# Patient Record
Sex: Female | Born: 1990 | Race: Black or African American | Hispanic: No | Smoking: Never smoker
Health system: Southern US, Community
[De-identification: ages and names within clinical notes are randomized; demographics above are authoritative.]

## PROBLEM LIST (undated history)

## (undated) DIAGNOSIS — K760 Fatty (change of) liver, not elsewhere classified: Secondary | ICD-10-CM

## (undated) DIAGNOSIS — K219 Gastro-esophageal reflux disease without esophagitis: Secondary | ICD-10-CM

## (undated) DIAGNOSIS — Z8759 Personal history of other complications of pregnancy, childbirth and the puerperium: Secondary | ICD-10-CM

## (undated) DIAGNOSIS — F329 Major depressive disorder, single episode, unspecified: Secondary | ICD-10-CM

## (undated) DIAGNOSIS — F419 Anxiety disorder, unspecified: Secondary | ICD-10-CM

## (undated) DIAGNOSIS — F32A Depression, unspecified: Secondary | ICD-10-CM

## (undated) DIAGNOSIS — T4145XA Adverse effect of unspecified anesthetic, initial encounter: Secondary | ICD-10-CM

## (undated) DIAGNOSIS — R519 Headache, unspecified: Secondary | ICD-10-CM

## (undated) DIAGNOSIS — J342 Deviated nasal septum: Secondary | ICD-10-CM

## (undated) DIAGNOSIS — I2699 Other pulmonary embolism without acute cor pulmonale: Secondary | ICD-10-CM

## (undated) DIAGNOSIS — T8859XA Other complications of anesthesia, initial encounter: Secondary | ICD-10-CM

## (undated) DIAGNOSIS — I10 Essential (primary) hypertension: Secondary | ICD-10-CM

## (undated) HISTORY — PX: NASAL SEPTUM SURGERY: SHX37

## (undated) HISTORY — PX: BREAST REDUCTION SURGERY: SHX8

## (undated) HISTORY — PX: WISDOM TOOTH EXTRACTION: SHX21

## (undated) HISTORY — DX: Essential (primary) hypertension: I10

## (undated) HISTORY — PX: TONSILLECTOMY: SUR1361

## (undated) HISTORY — DX: Deviated nasal septum: J34.2

## (undated) HISTORY — DX: Fatty (change of) liver, not elsewhere classified: K76.0

---
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
## 2019-05-01 ENCOUNTER — Other Ambulatory Visit: Payer: Self-pay

## 2019-05-01 ENCOUNTER — Encounter: Payer: Self-pay | Admitting: Adult Health

## 2019-05-01 ENCOUNTER — Ambulatory Visit (INDEPENDENT_AMBULATORY_CARE_PROVIDER_SITE_OTHER): Payer: 59 | Admitting: Adult Health

## 2019-05-01 DIAGNOSIS — Z3A01 Less than 8 weeks gestation of pregnancy: Secondary | ICD-10-CM | POA: Insufficient documentation

## 2019-05-01 DIAGNOSIS — O3680X0 Pregnancy with inconclusive fetal viability, not applicable or unspecified: Secondary | ICD-10-CM | POA: Diagnosis not present

## 2019-05-01 DIAGNOSIS — Z3201 Encounter for pregnancy test, result positive: Secondary | ICD-10-CM

## 2019-05-01 MED ORDER — PRENATAL PLUS 27-1 MG PO TABS: 1.0000 | ORAL_TABLET | Freq: Every day | ORAL | 12 refills | Status: DC

## 2019-05-01 NOTE — Progress Notes (Signed)
Patient ID: Terri Cochran, female   DOB: 05/21/1991, 28 y.o.   MRN: [id number]   TELEHEALTH VIRTUAL GYNECOLOGY VISIT ENCOUNTER NOTE  I connected with Terri Cochran on 04/30/28 at 10:15 AM EDT by telephone at home and verified that I am speaking with the correct person using two identifiers.   I discussed the limitations, risks, security and privacy concerns of performing an evaluation and management service by telephone and the availability of in person appointments. I also discussed with the patient that there may be a patient responsible charge related to this service. The patient expressed understanding and agreed to proceed.   History:  Terri Cochran is a 28 y.o. G1P0 female being evaluated today for having missed a period and had 4+HPTs, so about 5+6 weeks by LMP 03/21/19, with EDD 12/26/19, she got married 5/8 and had sex for the first time then. She has some low back pain. She denies any abnormal vaginal discharge, bleeding, pelvic pain or other concerns.       Past Medical History:  Diagnosis Date  . Deviated septum    Past Surgical History:  Procedure Laterality Date  . BREAST REDUCTION SURGERY    . NASAL SEPTUM SURGERY     The following portions of the patient's history were reviewed and updated as appropriate: allergies, current medications, past family history, past medical history, past social history, past surgical history and problem list.   Health Maintenance:  None on file.  Review of Systems:  Pertinent items noted in HPI and remainder of comprehensive ROS otherwise negative.  Physical Exam:   General:  Alert, oriented and cooperative.   Mental Status: Normal mood and affect perceived. Normal judgment and thought content.  Physical exam deferred due to nature of the encounter Fall risk is low. PHQ 2 score 0. She is aware deliveries at [facility] and about after hours call service. Reviewed symptoms of early pregnancy with her.   Labs and Imaging No results found for this or any  previous visit (from the past 336 hour(s)). No results found.    Assessment and Plan:     1. Positive pregnancy test 4+HPTs  2. Less than [redacted] weeks gestation of pregnancy -take PNV -can take tylenol and use ice for low back pain  3. Encounter to determine fetal viability of pregnancy, single or unspecified fetus -dating [place] in 2-3 weeks  - US OB Comp Less 14 Wks; Future       I discussed the assessment and treatment plan with the patient. The patient was provided an opportunity to ask questions and all were answered. The patient agreed with the plan and demonstrated an understanding of the instructions.   The patient was advised to call back or seek an in-person evaluation/go to the ED if the symptoms worsen or if the condition fails to improve as anticipated.  I provided 7 minutes of non-face-to-face time during this encounter.   Derrek Monaco, NP [facility] [employer] [facility], [place]

## 2019-05-07 ENCOUNTER — Telehealth: Payer: Self-pay | Admitting: Adult Health

## 2019-05-07 ENCOUNTER — Other Ambulatory Visit: Payer: Self-pay

## 2019-05-07 ENCOUNTER — Emergency Department: Payer: 59

## 2019-05-07 ENCOUNTER — Encounter: Payer: Self-pay | Admitting: Emergency Medicine

## 2019-05-07 ENCOUNTER — Emergency Department
Admission: EM | Admit: 2019-05-07 | Discharge: 2019-05-07 | Disposition: A | Discharge status: Home / Self Care | Payer: 59 | Attending: Emergency Medicine | Admitting: Emergency Medicine

## 2019-05-07 ENCOUNTER — Telehealth: Payer: Self-pay | Admitting: *Deleted

## 2019-05-07 DIAGNOSIS — O418X11 Other specified disorders of amniotic fluid and membranes, first trimester, fetus 1: Secondary | ICD-10-CM | POA: Diagnosis not present

## 2019-05-07 DIAGNOSIS — O209 Hemorrhage in early pregnancy, unspecified: Secondary | ICD-10-CM | POA: Diagnosis present

## 2019-05-07 DIAGNOSIS — Z3A01 Less than 8 weeks gestation of pregnancy: Secondary | ICD-10-CM | POA: Insufficient documentation

## 2019-05-07 DIAGNOSIS — O418X10 Other specified disorders of amniotic fluid and membranes, first trimester, not applicable or unspecified: Secondary | ICD-10-CM

## 2019-05-07 DIAGNOSIS — O4690 Antepartum hemorrhage, unspecified, unspecified trimester: Secondary | ICD-10-CM

## 2019-05-07 DIAGNOSIS — O468X1 Other antepartum hemorrhage, first trimester: Secondary | ICD-10-CM

## 2019-05-07 LAB — COMPREHENSIVE METABOLIC PANEL
ALT: 22 U/L (ref 0–44)
AST: 27 U/L (ref 15–41)
Albumin: 4.6 g/dL (ref 3.5–5.0)
Alkaline Phosphatase: 50 U/L (ref 38–126)
Anion gap: 12 (ref 5–15)
BUN: 15 mg/dL (ref 6–20)
CO2: 22 mmol/L (ref 22–32)
Calcium: 9.5 mg/dL (ref 8.9–10.3)
Chloride: 105 mmol/L (ref 98–111)
Creatinine, Ser: 0.96 mg/dL (ref 0.44–1.00)
GFR calc Af Amer: >60 mL/min (ref >60)
GFR calc non Af Amer: >60 mL/min (ref >60)
Glucose, Bld: 98 mg/dL (ref 70–99)
Potassium: 3.9 mmol/L (ref 3.5–5.1)
Sodium: 139 mmol/L (ref 135–145)
Total Bilirubin: 0.3 mg/dL (ref 0.3–1.2)
Total Protein: 8.5 g/dL — ABNORMAL HIGH (ref 6.5–8.1)

## 2019-05-07 LAB — URINALYSIS, COMPLETE (UACMP) WITH MICROSCOPIC
Bilirubin Urine: NEGATIVE
Glucose, UA: NEGATIVE mg/dL
Ketones, ur: NEGATIVE mg/dL
Nitrite: NEGATIVE
Protein, ur: NEGATIVE mg/dL
Specific Gravity, Urine: 1.018 (ref 1.005–1.030)
pH: 7 (ref 5.0–8.0)

## 2019-05-07 LAB — CBC WITH DIFFERENTIAL/PLATELET
Abs Immature Granulocytes: 0.02 10*3/uL (ref 0.00–0.07)
Basophils Absolute: 0.1 10*3/uL (ref 0.0–0.1)
Basophils Relative: 1 %
Eosinophils Absolute: 0.3 10*3/uL (ref 0.0–0.5)
Eosinophils Relative: 2 %
HCT: 41.2 % (ref 36.0–46.0)
Hemoglobin: 13.3 g/dL (ref 12.0–15.0)
Immature Granulocytes: 0 %
Lymphocytes Relative: 28 %
Lymphs Abs: 3.3 10*3/uL (ref 0.7–4.0)
MCH: 26 pg (ref 26.0–34.0)
MCHC: 32.3 g/dL (ref 30.0–36.0)
MCV: 80.6 fL (ref 80.0–100.0)
Monocytes Absolute: 0.9 10*3/uL (ref 0.1–1.0)
Monocytes Relative: 8 %
Neutro Abs: 7.1 10*3/uL (ref 1.7–7.7)
Neutrophils Relative %: 61 %
Platelets: 367 10*3/uL (ref 150–400)
RBC: 5.11 MIL/uL (ref 3.87–5.11)
RDW: 15.2 % (ref 11.5–15.5)
WBC: 11.7 10*3/uL — ABNORMAL HIGH (ref 4.0–10.5)
nRBC: 0 % (ref 0.0–0.2)

## 2019-05-07 LAB — PREGNANCY, URINE: Preg Test, Ur: POSITIVE — AB

## 2019-05-07 LAB — HCG, QUANTITATIVE, PREGNANCY: hCG, Beta Chain, Quant, S: 17650 m[IU]/mL — ABNORMAL HIGH (ref <5)

## 2019-05-07 LAB — ABO/RH: ABO/RH(D): B POS
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
## 2019-05-07 NOTE — ED Notes (Signed)
EDP Goodman at bedside.  

## 2019-05-07 NOTE — ED Notes (Signed)
Pt walked out without d/c paperwork, vital signs, etc. Pt did not ask for this RN or let this RN know she was leaving.
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
## 2019-05-07 NOTE — Discharge Instructions (Signed)
Please seek medical attention for any high fevers, chest pain, shortness of breath, change in behavior, persistent vomiting, bloody stool or any other new or concerning symptoms.  

## 2019-05-07 NOTE — ED Triage Notes (Signed)
Pt states she is about [redacted] weeks pregnant with her first baby, states when she wipes, bright red blood on paper, began this afternoon. Mild LLQ cramping today as well. NAD.
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
## 2019-05-09 ENCOUNTER — Telehealth: Payer: Self-pay | Admitting: Obstetrics and Gynecology

## 2019-05-09 NOTE — Telephone Encounter (Signed)
Pulled pt's chart to receiving a fax from ED stating she has a ED referral Summary with disclaimer. After pulling pt, ED possibly faxed referral to wrong office, Pt has never been seen at [facility]. Thank you.

## 2019-05-15 ENCOUNTER — Telehealth: Payer: Self-pay | Admitting: Obstetrics and Gynecology

## 2019-05-15 NOTE — Telephone Encounter (Signed)

## 2019-05-16 ENCOUNTER — Other Ambulatory Visit: Payer: Self-pay

## 2019-05-16 ENCOUNTER — Ambulatory Visit (INDEPENDENT_AMBULATORY_CARE_PROVIDER_SITE_OTHER): Payer: 59

## 2019-05-16 DIAGNOSIS — O3680X0 Pregnancy with inconclusive fetal viability, not applicable or unspecified: Secondary | ICD-10-CM | POA: Diagnosis not present

## 2019-05-16 DIAGNOSIS — Z3A08 8 weeks gestation of pregnancy: Secondary | ICD-10-CM | POA: Diagnosis not present

## 2019-05-16 NOTE — Progress Notes (Signed)
[place] 7+1 wks,single IUP w/ys,positive fht 150 bpm,normal ovaries bilat,no fibroid visualized,a small posterior myometrial cyst 7 x 4 x 6 mm,CRL 10.98 mm

## 2019-06-13 ENCOUNTER — Telehealth: Payer: Self-pay

## 2019-06-13 NOTE — Telephone Encounter (Signed)
Copied from [place] [phone]. Topic: Appointment Scheduling - Scheduling Inquiry for Clinic >> Jun 13, 2019  2:00 PM Oneta Rack wrote: Patient would like to establish care with Dr. Derrel Nip due to spouse Rosine Beat [phone] already being a patient of Dr. Derrel Nip, please advise (aware Dr. Derrel Nip but wanted to ask) please advise

## 2019-06-16 ENCOUNTER — Other Ambulatory Visit: Payer: Self-pay

## 2019-06-16 ENCOUNTER — Inpatient Hospital Stay (HOSPITAL_COMMUNITY)
Admission: EM | Admit: 2019-06-16 | Discharge: 2019-06-16 | Disposition: A | Discharge status: Home / Self Care | Payer: 59 | Attending: Obstetrics & Gynecology | Admitting: Obstetrics & Gynecology

## 2019-06-16 ENCOUNTER — Encounter (HOSPITAL_COMMUNITY): Payer: Self-pay | Admitting: *Deleted

## 2019-06-16 DIAGNOSIS — Z3A11 11 weeks gestation of pregnancy: Secondary | ICD-10-CM

## 2019-06-16 DIAGNOSIS — O208 Other hemorrhage in early pregnancy: Secondary | ICD-10-CM | POA: Diagnosis not present

## 2019-06-16 DIAGNOSIS — O209 Hemorrhage in early pregnancy, unspecified: Secondary | ICD-10-CM | POA: Insufficient documentation

## 2019-06-16 HISTORY — DX: Anxiety disorder, unspecified: F41.9

## 2019-06-16 HISTORY — DX: Depression, unspecified: F32.A

## 2019-06-16 HISTORY — DX: Headache, unspecified: R51.9

## 2019-06-16 HISTORY — DX: Gastro-esophageal reflux disease without esophagitis: K21.9

## 2019-06-16 MED ORDER — TERCONAZOLE 0.4 % VA CREA: 1.0000 | TOPICAL_CREAM | Freq: Every day | VAGINAL | 0 refills | Status: DC

## 2019-06-16 NOTE — MAU Provider Note (Signed)
History     CSN: [id number]  Arrival date and time: 06/16/19 2106  Chief Complaint  Patient presents with  . Vaginal Bleeding   HPI Terri Cochran is a 28 y.o. G1P0 at [redacted]w[redacted]d who presents with vaginal bleeding. She was seen in June at [facility] and diagnosed with a subchorionic hemorrhage and had a viable pregnancy on a follow up ultrasound a week later. She states she has been seeing blood daily since then but had more tonight. She denies any pain. Denies any abnormal discharge. She has been seen at [facility] but planning to go somewhere closer to [place] in the future.   OB History    Gravida  1   Para      Term      Preterm      AB      Living        SAB      TAB      Ectopic      Multiple      Live Births              Past Medical History:  Diagnosis Date  . Anxiety   . Depression   . Deviated septum   . GERD (gastroesophageal reflux disease)   . Headache     Past Surgical History:  Procedure Laterality Date  . BREAST REDUCTION SURGERY    . NASAL SEPTUM SURGERY    . TONSILLECTOMY    . WISDOM TOOTH EXTRACTION      Family History  Problem Relation Age of Onset  . Heart attack Father     Social History   Tobacco Use  . Smoking status: Never Smoker  . Smokeless tobacco: Never Used  Substance Use Topics  . Alcohol use: Not Currently  . Drug use: Not Currently    Allergies: No Known Allergies  Medications Prior to Admission  Medication Sig Dispense Refill Last Dose  . prenatal vitamin w/FE, FA (PRENATAL 1 + 1) 27-1 MG TABS tablet Take 1 tablet by mouth daily at 12 noon. 30 each 12 06/16/2019 at Unknown time    Review of Systems  Constitutional: Negative.  Negative for fatigue and fever.  HENT: Negative.   Respiratory: Negative.  Negative for shortness of breath.   Cardiovascular: Negative.  Negative for chest pain.  Gastrointestinal: Negative.  Negative for abdominal pain, constipation, diarrhea, nausea and vomiting.  Genitourinary:  Positive for vaginal bleeding. Negative for dysuria.  Neurological: Negative.  Negative for dizziness and headaches.   Physical Exam   Last menstrual period 03/21/2019.  Physical Exam  Nursing note and vitals reviewed. Constitutional: She is oriented to person, place, and time. She appears well-developed and well-nourished. No distress.  HENT:  Head: Normocephalic.  Eyes: Pupils are equal, round, and reactive to light.  Cardiovascular: Normal rate, regular rhythm and normal heart sounds.  Respiratory: Effort normal and breath sounds normal. No respiratory distress.  GI: Soft. Bowel sounds are normal. She exhibits no distension. There is no abdominal tenderness.  Genitourinary:    Vaginal discharge (Thick, white, adherent discharge) present.     Genitourinary Comments: Small amount of dark red blood in vault. No active bleeding. Cervix closed/thick posterior.    Neurological: She is alert and oriented to person, place, and time.  Skin: Skin is warm and dry.  Psychiatric: She has a normal mood and affect. Her behavior is normal. Judgment and thought content normal.    MAU Course  Procedures  MDM B Pos blood type  Limited bedside u/s performed to confirm viability. Fetus measuring approximately 11 weeks CRL with FHR of 167 bpm noted.   CNM collected vaginal swabs and UA but were sent to lab with wrong requisition. Lab discarded samples and patient declined recollect tonight.   Discussed with patient normalcy of bleeding with known subchorionic hemorrhage. Reassured that bleeding may continue during early pregnancy but fetus is doing well today.   Yeast visualized on exam. Will treat due to clinical presentation  Assessment and Plan   1. Vaginal bleeding affecting early pregnancy   2. [redacted] weeks gestation of pregnancy    -Discharge home in stable condition -Rx for terazol sent to patient's pharmacy -Vaginal bleeding precautions discussed -Patient advised to follow-up with OB of  choice to start prenatal care -Patient may return to MAU as needed or if her condition were to change or worsen   [place] 06/16/2019, 10:59 PM

## 2019-06-16 NOTE — Discharge Instructions (Signed)
Subchorionic Hematoma  A subchorionic hematoma is a gathering of blood between the outer wall of the embryo (chorion) and the inner wall of the womb (uterus). This condition can cause vaginal bleeding. If they cause little or no vaginal bleeding, early small hematomas usually shrink on their own and do not affect your baby or pregnancy. When bleeding starts later in pregnancy, or if the hematoma is larger or occurs in older pregnant women, the condition may be more serious. Larger hematomas may get bigger, which increases the chances of miscarriage. This condition also increases the risk of:  Premature separation of the placenta from the uterus.  Premature (preterm) labor.  Stillbirth. What are the causes? The exact cause of this condition is not known. It occurs when blood is trapped between the placenta and the uterine wall because the placenta has separated from the original site of implantation. What increases the risk? You are more likely to develop this condition if:  You were treated with fertility medicines.  You conceived through in vitro fertilization (IVF). What are the signs or symptoms? Symptoms of this condition include:  Vaginal spotting or bleeding.  Contractions of the uterus. These cause abdominal pain. Sometimes you may have no symptoms and the bleeding may only be seen when ultrasound images are taken (transvaginal ultrasound). How is this diagnosed? This condition is diagnosed based on a physical exam. This includes a pelvic exam. You may also have other tests, including:  Blood tests.  Urine tests.  Ultrasound of the abdomen. How is this treated? Treatment for this condition can vary. Treatment may include:  Watchful waiting. You will be monitored closely for any changes in bleeding. During this stage: ? The hematoma may be reabsorbed by the body. ? The hematoma may separate the fluid-filled space containing the embryo (gestational sac) from the wall of the  womb (endometrium).  Medicines.  Activity restriction. This may be needed until the bleeding stops. Follow these instructions at home:  Stay on bed rest if told to do so by your health care provider.  Do not lift anything that is heavier than 10 lbs. (4.5 kg) or as told by your health care provider.  Do not use any products that contain nicotine or tobacco, such as cigarettes and e-cigarettes. If you need help quitting, ask your health care provider.  Track and write down the number of pads you use each day and how soaked (saturated) they are.  Do not use tampons.  Keep all follow-up visits as told by your health care provider. This is important. Your health care provider may ask you to have follow-up blood tests or ultrasound tests or both. Contact a health care provider if:  You have any vaginal bleeding.  You have a fever. Get help right away if:  You have severe cramps in your stomach, back, abdomen, or pelvis.  You pass large clots or tissue. Save any tissue for your health care provider to look at.  You have more vaginal bleeding, and you faint or become lightheaded or weak. Summary  A subchorionic hematoma is a gathering of blood between the outer wall of the placenta and the uterus.  This condition can cause vaginal bleeding.  Sometimes you may have no symptoms and the bleeding may only be seen when ultrasound images are taken.  Treatment may include watchful waiting, medicines, or activity restriction. This information is not intended to replace advice given to you by your health care provider. Make sure you discuss any questions you  have with your health care provider. Document Released: 02/22/2007 Document Revised: 10/20/2017 Document Reviewed: 01/03/2017 Elsevier Patient Education  [place] Medications in Pregnancy   Acne: Benzoyl Peroxide Salicylic Acid  Backache/Headache: Tylenol: 2 regular strength every 4 hours OR              2  Extra strength every 6 hours  Colds/Coughs/Allergies: Benadryl (alcohol free) 25 mg every 6 hours as needed Breath right strips Claritin Cepacol throat lozenges Chloraseptic throat spray Cold-Eeze- up to three times per day Cough drops, alcohol free Flonase (by prescription only) Guaifenesin Mucinex Robitussin DM (plain only, alcohol free) Saline nasal spray/drops Sudafed (pseudoephedrine) & Actifed ** use only after [redacted] weeks gestation and if you do not have high blood pressure Tylenol Vicks Vaporub Zinc lozenges Zyrtec   Constipation: Colace Ducolax suppositories Fleet enema Glycerin suppositories Metamucil Milk of magnesia Miralax Senokot Smooth move tea  Diarrhea: Kaopectate Imodium A-D  *NO pepto Bismol  Hemorrhoids: Anusol Anusol HC Preparation H Tucks  Indigestion: Tums Maalox Mylanta Zantac  Pepcid  Insomnia: Benadryl (alcohol free) 25mg  every 6 hours as needed Tylenol PM Unisom, no Gelcaps  Leg Cramps: Tums MagGel  Nausea/Vomiting:  Bonine Dramamine Emetrol Ginger extract Sea bands Meclizine  Nausea medication to take during pregnancy:  Unisom (doxylamine succinate 25 mg tablets) Take one tablet daily at bedtime. If symptoms are not adequately controlled, the dose can be increased to a maximum recommended dose of two tablets daily (1/2 tablet in the morning, 1/2 tablet mid-afternoon and one at bedtime). Vitamin B6 100mg  tablets. Take one tablet twice a day (up to 200 mg per day).  Skin Rashes: Aveeno products Benadryl cream or 25mg  every 6 hours as needed Calamine Lotion 1% cortisone cream  Yeast infection: Gyne-lotrimin 7 Monistat 7   **If taking multiple medications, please check labels to avoid duplicating the same active ingredients **take medication as directed on the label ** Do not exceed 4000 mg of tylenol in 24 hours **Do not take medications that contain aspirin or ibuprofen

## 2019-06-18 NOTE — Telephone Encounter (Signed)
Lm on vm to call the office to set up a virtual new patient appt, 06/18/2019.

## 2019-06-18 NOTE — Telephone Encounter (Signed)
Please schedule a new pt appt to establish care.

## 2019-06-18 NOTE — Telephone Encounter (Signed)
Ok to establish care with me.

## 2019-06-19 ENCOUNTER — Telehealth: Payer: 59

## 2019-06-20 ENCOUNTER — Encounter: Payer: Self-pay | Admitting: Obstetrics and Gynecology

## 2019-06-24 ENCOUNTER — Telehealth: Payer: Self-pay | Admitting: Radiology

## 2019-06-24 ENCOUNTER — Other Ambulatory Visit: Payer: BLUE CROSS/BLUE SHIELD

## 2019-06-24 ENCOUNTER — Encounter: Payer: Self-pay | Admitting: Radiology

## 2019-06-24 ENCOUNTER — Inpatient Hospital Stay (HOSPITAL_COMMUNITY): Payer: 59

## 2019-06-24 ENCOUNTER — Encounter (HOSPITAL_COMMUNITY): Payer: Self-pay

## 2019-06-24 ENCOUNTER — Inpatient Hospital Stay (HOSPITAL_COMMUNITY)
Admission: AD | Admit: 2019-06-24 | Discharge: 2019-06-24 | Disposition: A | Discharge status: Home / Self Care | Payer: 59 | Attending: Obstetrics and Gynecology | Admitting: Obstetrics and Gynecology

## 2019-06-24 ENCOUNTER — Encounter: Payer: BLUE CROSS/BLUE SHIELD | Admitting: Women's Health

## 2019-06-24 ENCOUNTER — Other Ambulatory Visit: Payer: Self-pay

## 2019-06-24 ENCOUNTER — Ambulatory Visit: Payer: BLUE CROSS/BLUE SHIELD | Admitting: *Deleted

## 2019-06-24 DIAGNOSIS — O039 Complete or unspecified spontaneous abortion without complication: Secondary | ICD-10-CM

## 2019-06-24 DIAGNOSIS — Z6720 Type B blood, Rh positive: Secondary | ICD-10-CM

## 2019-06-24 DIAGNOSIS — O4690 Antepartum hemorrhage, unspecified, unspecified trimester: Secondary | ICD-10-CM

## 2019-06-24 LAB — CBC
HCT: 36.1 % (ref 36.0–46.0)
Hemoglobin: 11.9 g/dL — ABNORMAL LOW (ref 12.0–15.0)
MCH: 26.6 pg (ref 26.0–34.0)
MCHC: 33 g/dL (ref 30.0–36.0)
MCV: 80.8 fL (ref 80.0–100.0)
Platelets: 350 10*3/uL (ref 150–400)
RBC: 4.47 MIL/uL (ref 3.87–5.11)
RDW: 14.5 % (ref 11.5–15.5)
WBC: 8.8 10*3/uL (ref 4.0–10.5)
nRBC: 0 % (ref 0.0–0.2)

## 2019-06-24 MED ORDER — HYDROCODONE-ACETAMINOPHEN 5-325 MG PO TABS
1.0000 | ORAL_TABLET | Freq: Once | ORAL | Status: AC
  Administered 2019-06-24: 1 via ORAL
  Filled 2019-06-24: qty 1

## 2019-06-24 MED ORDER — HYDROCODONE-ACETAMINOPHEN 5-325 MG PO TABS: 1.0000 | ORAL_TABLET | Freq: Four times a day (QID) | ORAL | 0 refills | Status: DC | PRN

## 2019-06-24 MED ORDER — ONDANSETRON 4 MG PO TBDP
8.0000 mg | ORAL_TABLET | Freq: Once | ORAL | Status: AC
  Administered 2019-06-24: 8 mg via ORAL
  Filled 2019-06-24: qty 2

## 2019-06-24 MED ORDER — FLUCONAZOLE 150 MG PO TABS
150.0000 mg | ORAL_TABLET | Freq: Once | ORAL | Status: AC
  Administered 2019-06-24: 150 mg via ORAL
  Filled 2019-06-24: qty 1

## 2019-06-24 NOTE — MAU Provider Note (Addendum)
History     CSN: [id number]  Arrival date and time: 06/24/19 1250   First Provider Initiated Contact with Patient 06/24/19 1547      Chief Complaint  Patient presents with  . Vaginal Bleeding   HPI [name] is a 28 y/o G1P0010 seen in MAU for increased bleeding and contractions after a spontaneous abortion on 06/20/2019 @[redacted]w[redacted]d . Pt was diagnosed @7w  gestation with moderate subchorionic hemorrhage measuring 1.8 x 1.3 x 1.2 cm during an ER visit for vaginal bleeding. Pt last seen at MAU on 06/16/2019 for vaginal bleeding and discharged home in stable condition after a bedside US confirmed viability of fetus measuring 11 wks CRL with FHR 167 bpm. Pt was seen in an ER in [facility] on 06/19/2019 for contractions, increased vaginal bleeding, and passing of tissue. Pt was discharged home following normal [place]. Pt returned to ER the next day after passing her fetus in her hotel room. US showed incomplete abortion and Pt was given 4 pills of Cytotec. Vaginal bleeding had slowed following the Cytotec, but she experienced an increase this morning. Pt was unable to establish care with an OBGYN during recent pregnancy.   OB History    Gravida  1   Para      Term      Preterm      AB      Living        SAB      TAB      Ectopic      Multiple      Live Births              Past Medical History:  Diagnosis Date  . Anxiety   . Depression   . Deviated septum   . GERD (gastroesophageal reflux disease)   . Headache     Past Surgical History:  Procedure Laterality Date  . BREAST REDUCTION SURGERY    . NASAL SEPTUM SURGERY    . TONSILLECTOMY    . WISDOM TOOTH EXTRACTION      Family History  Problem Relation Age of Onset  . Heart attack Father     Social History   Tobacco Use  . Smoking status: Never Smoker  . Smokeless tobacco: Never Used  Substance Use Topics  . Alcohol use: Not Currently  . Drug use: Not Currently    Allergies: No Known  Allergies  No medications prior to admission.   Results for orders placed or performed during the hospital encounter of 06/24/19 (from the past 48 hour(s))  CBC     Status: Abnormal   Collection Time: 06/24/19  1:29 PM  Result Value Ref Range   WBC 8.8 4.0 - 10.5 K/uL   RBC 4.47 3.87 - 5.11 MIL/uL   Hemoglobin 11.9 (L) 12.0 - 15.0 g/dL   HCT 36.1 36.0 - 46.0 %   MCV 80.8 80.0 - 100.0 fL   MCH 26.6 26.0 - 34.0 pg   MCHC 33.0 30.0 - 36.0 g/dL   RDW 14.5 11.5 - 15.5 %   Platelets 350 150 - 400 K/uL   nRBC 0.0 0.0 - 0.2 %    Comment: Performed at [facility], [place] [address] [place], [place] [address]   US Ob Less Than 14 Weeks With Ob Transvaginal  Result Date: 06/24/2019 CLINICAL DATA:  Vaginal bleeding, cramping EXAM: OBSTETRIC <14 WK [place] AND TRANSVAGINAL OB US TECHNIQUE: Both transabdominal and transvaginal ultrasound examinations were performed for complete evaluation of the  gestation as well as the maternal uterus, adnexal regions, and pelvic cul-de-sac. Transvaginal technique was performed to assess early pregnancy. COMPARISON:  05/16/2019 FINDINGS: The previously seen intrauterine gestational sac is no longer identified. The endometrium measures 12 mm in thickness. No areas of internal vascularity within the endometrium. Trace fluid in the endocervical canal. Uterus is otherwise unremarkable. Right ovary is normal in appearance measuring 4.3 x 1.3 x 3.0 cm. Left ovary is normal in appearance measuring 2.9 x 1.5 x 2.1 cm. There is no free fluid within the pelvis. IMPRESSION: Previously seen endometrial sac is no longer identified. No evidence of intrauterine pregnancy. No evidence of retained products of conception. Electronically Signed   By: Davina Poke M.D.   On: 06/24/2019 16:45   Review of Systems  Gastrointestinal: Positive for abdominal pain, diarrhea, nausea and vomiting.  Genitourinary: Positive for pelvic pain and vaginal bleeding.  Neurological: Negative for  dizziness and headaches.  Psychiatric/Behavioral: The patient is nervous/anxious.    Physical Exam   Blood pressure 107/75, pulse 70, temperature 98.4 F (36.9 C), temperature source Oral, resp. rate 18, weight 106.4 kg, last menstrual period 03/21/2019, SpO2 100 %.  Physical Exam  Constitutional: She is oriented to person, place, and time. She appears well-developed and well-nourished. No distress.  Cardiovascular: Normal heart sounds.  Respiratory: Breath sounds normal.  GI: There is abdominal tenderness in the suprapubic area.  + suprapubic tenderness with bimanual exam   Genitourinary:    Genitourinary Comments: Vagina - Small amount dark red blood in vault.  Cervix - small active bleeding oozing from os.  Bimanual exam: Cervix closed, anterior, soft Chaperone present for exam.    Neurological: She is alert and oriented to person, place, and time.  Psychiatric: She exhibits a depressed mood.  Tearful   MAU Course  Procedures   MDM  B positive blood type  CBC - WNL Transabdominal and transvaginal US - Impression: previously seen endometrial sac is no longer identified. No evidence of intrauterine pregnancy. No evidence of retained products of conception. Yeast infection - 150 mg Fluconazole (Diflucan) once, diagnosed at previous hospital.  Vicodin 1 tablet, pain significantly improved.   Assessment and Plan  1. Complete spontaneous abortion - expect transient vaginal bleeding for the next 1-2 weeks. Will monitor hCG levels until they return to 0.  2. Pain - Rx: 5-325 mg Hydrocodone-acetaminophen (Norco) tablet q6 hrs, prn, continue ibuprofen PRN OTC. 3. F/U - referral sent for f/u in 1 week at [place] at [facility]. They will call pt to schedule f/u. 4. Rest - Pt provided work note for 1 week to allow time for grieving. 5. Discharged home in stable condition with return precautions. Return to MAU if symptoms worsen.  Orland Penman, PA-S, [place] 06/24/2019, 8:01 PM    I confirm that I have verified the information documented in the physician assistant student's note and that I have also personally reperformed the history, physical exam and all medical decision making activities of this service and have verified that all service and findings are accurately documented in this student's note.     Noni Saupe I, NP 06/24/2019 8:05 PM

## 2019-06-24 NOTE — Discharge Instructions (Signed)
Managing Pregnancy Loss °Pregnancy loss can happen any time during a pregnancy. Often the cause is not known. It is rarely because of anything you did. Pregnancy loss in early pregnancy (during the first trimester) is called a miscarriage. This type of pregnancy loss is the most common. Pregnancy loss that happens after 20 weeks of pregnancy is called fetal demise if the baby's heart stops beating before birth. Fetal demise is much less common. Some women experience spontaneous labor shortly after fetal demise resulting in a stillborn birth (stillbirth). °Any pregnancy loss can be devastating. You will need to recover both physically and emotionally. Most women are able to get pregnant again after a pregnancy loss and deliver a healthy baby. °How to manage emotional recovery ° °Pregnancy loss is very hard emotionally. You may feel many different emotions while you grieve. You may feel sad and angry. You may also feel guilty. It is normal to have periods of crying. Emotional recovery can take longer than physical recovery. It is different for everyone. °Taking these steps can help you in managing this loss: °· Remember that it is unlikely you did anything to cause the pregnancy loss. °· Share your thoughts and feelings with friends, family, and your partner. Remember that your partner is also recovering emotionally. °· Make sure you have a good support system. Do not spend too much time alone. °· Meet with a pregnancy loss counselor or join a pregnancy loss support group. °· Get enough sleep and eat a healthy diet. Return to regular exercise when you have recovered physically. °· Do not use drugs or alcohol to manage your emotions. °· Consider seeing a mental health professional to help you recover emotionally. °· Ask a friend or loved one to help you decide what to do with any clothing and nursery items you received for your baby. °In the case of a stillbirth, many women benefit from taking additional steps in the  grieving process. You may want to: °· Hold your baby after the birth. °· Name your baby. °· Request a birth certificate. °· Create a keepsake such as handprints or footprints. °· Dress your baby and have a picture taken. °· Make funeral arrangements. °· Ask for a baptism or blessing. °Hospitals have staff members who can help you with all these arrangements. °How to recognize emotional stress °It is normal to have emotional stress after a pregnancy loss. But emotional stress that lasts a long time or becomes severe requires treatment. Watch out for these signs of severe emotional stress: °· Sadness, anger, or guilt that is not going away and is interfering with your normal activities. °· Relationship problems that have occurred or gotten worse since the pregnancy loss. °· Signs of depression that last longer than 2 weeks. These may include: °? Sadness. °? Anxiety. °? Hopelessness. °? Loss of interest in activities you enjoy. °? Inability to concentrate. °? Trouble sleeping or sleeping too much. °? Loss of appetite or overeating. °? Thoughts of death or of hurting yourself. °Follow these instructions at home: °· Take over-the-counter and prescription medicines only as told by your health care provider. °· Rest at home until your energy level returns. Return to your normal activities as told by your health care provider. Ask your health care provider what activities are safe for you. °· When you are ready, meet with your health care provider to discuss steps to take for a future pregnancy. °· Keep all follow-up visits as told by your health care provider. This is important. °  Where to find support °· To help you and your partner with the process of grieving, talk with your health care provider or seek counseling. °· Consider meeting with others who have experienced pregnancy loss. Ask your health care provider about support groups and resources. °Where to find more information °· U.S. Department of Health and Human  Services Office on Women's Health: www.womenshealth.gov °· American Pregnancy Association: www.americanpregnancy.org °Contact a health care provider if: °· You continue to experience grief, sadness, or lack of motivation for everyday activities, and those feelings do not improve over time. °· You are struggling to recover emotionally, especially if you are using alcohol or substances to help. °Get help right away if: °· You have thoughts of hurting yourself or others. °If you ever feel like you may hurt yourself or others, or have thoughts about taking your own life, get help right away. You can go to your nearest emergency department or call: °· Your local emergency services (911 in the [place] °· A suicide crisis helpline, such as the National Suicide Prevention Lifeline at 1-800-273-8255. This is open 24 hours a day. °Summary °· Any pregnancy loss can be difficult physically and emotionally. °· You may experience many different emotions while you grieve. Emotional recovery can last longer than physical recovery. °· It is normal to have emotional stress after a pregnancy loss. But emotional stress that lasts a long time or becomes severe requires treatment. °· See your health care provider if you are struggling emotionally after a pregnancy loss. °This information is not intended to replace advice given to you by your health care provider. Make sure you discuss any questions you have with your health care provider. °Document Released: 01/18/2018 Document Revised: 02/27/2019 Document Reviewed: 01/18/2018 °Elsevier Patient Education © 2020 Elsevier Inc. ° °

## 2019-06-24 NOTE — MAU Note (Signed)
Was here last Sunday because she was bleeding heavily.  Wed night, it got bad she was passing tissue and contracting, went to the ER in [facility].  Baby was fine on [place], dx with UTI. An hour later, her pain was a 25 and she pushed out her baby.  On [place] she was dx as incomplete.  Was given 4 pills, was told bleeding would remain heavy.  Bleeding slowed.  Had appt with OB/GYN on Wed, on my chart her appt was cancelled, the office told her to come here.still cramping ("think it is contractions").

## 2019-06-24 NOTE — Telephone Encounter (Signed)
Patient called stating that she miscarried while on vacation, was seen at [place] [facility], for miscarriage. Patient was given Cytotec and was told to follow up with OB/GYN. Patient states that the cytotec has not produced anything and that she is in pain and cramping. Explained that we did not have an MD in office and instructed patient to go to [facility] in [place].

## 2019-06-25 ENCOUNTER — Telehealth: Payer: Self-pay | Admitting: Radiology

## 2019-06-25 NOTE — Telephone Encounter (Signed)
Called patient to schedule stat quant in 1 week and f/u visit in 2 weeks for  SAB, but no answer and unable to leave message due to full voicemail.

## 2019-06-26 ENCOUNTER — Encounter: Payer: 59 | Admitting: Advanced Practice Midwife

## 2019-07-01 ENCOUNTER — Other Ambulatory Visit: Payer: Self-pay

## 2019-07-01 ENCOUNTER — Other Ambulatory Visit: Payer: 59

## 2019-07-01 DIAGNOSIS — O039 Complete or unspecified spontaneous abortion without complication: Secondary | ICD-10-CM

## 2019-07-02 LAB — BETA HCG QUANT (REF LAB): hCG Quant: 126 m[IU]/mL

## 2019-07-09 ENCOUNTER — Encounter: Payer: Self-pay | Admitting: Obstetrics & Gynecology

## 2019-07-09 ENCOUNTER — Ambulatory Visit (INDEPENDENT_AMBULATORY_CARE_PROVIDER_SITE_OTHER): Payer: 59 | Admitting: Obstetrics & Gynecology

## 2019-07-09 ENCOUNTER — Ambulatory Visit: Payer: 59 | Admitting: Obstetrics & Gynecology

## 2019-07-09 VITALS — BP 111/77 | HR 72

## 2019-07-09 DIAGNOSIS — O039 Complete or unspecified spontaneous abortion without complication: Secondary | ICD-10-CM

## 2019-07-09 NOTE — Patient Instructions (Signed)
Return to clinic for any scheduled appointments or for any gynecologic concerns as needed.   

## 2019-07-09 NOTE — Progress Notes (Signed)
GYNECOLOGY OFFICE VISIT NOTE  History:   Terri Cochran is a 28 y.o. G1P0 here today for follow up after recent SAB. Seen in MAU two weeks ago and had an ultrasound that confirmed completion of SAB; she had a previously diagnosed 11 week fetus and had incomplete SAB that was treated with misoprostol.  She denies any current abnormal vaginal discharge, bleeding, pelvic pain or other concerns.    Past Medical History:  Diagnosis Date  . Anxiety   . Depression   . Deviated septum   . GERD (gastroesophageal reflux disease)   . Headache     Past Surgical History:  Procedure Laterality Date  . BREAST REDUCTION SURGERY    . NASAL SEPTUM SURGERY    . TONSILLECTOMY    . WISDOM TOOTH EXTRACTION      The following portions of the patient's history were reviewed and updated as appropriate: allergies, current medications, past family history, past medical history, past social history, past surgical history and problem list.   Health Maintenance:  Normal pap in 04/2019 at [place].    Review of Systems:  Pertinent items noted in HPI and remainder of comprehensive ROS otherwise negative.  Physical Exam:  BP 111/77   Pulse 72   LMP 03/21/2019  CONSTITUTIONAL: Well-developed, well-nourished female in no acute distress.  HEENT:  Normocephalic, atraumatic. External right and left ear normal. No scleral icterus.  NECK: Normal range of motion, supple, no masses noted on observation SKIN: No rash noted. Not diaphoretic. No erythema. No pallor. MUSCULOSKELETAL: Normal range of motion. No edema noted. NEUROLOGIC: Alert and oriented to person, place, and time. Normal muscle tone coordination. No cranial nerve deficit noted. PSYCHIATRIC: Normal mood and affect. Normal behavior. Normal judgment and thought content. CARDIOVASCULAR: Normal heart rate noted RESPIRATORY: Effort and breath sounds normal, no problems with respiration noted ABDOMEN: No masses noted. No other overt distention noted.    PELVIC: Deferred  Labs and Imaging No results found for this or any previous visit (from the past 168 hour(s)). US Ob Less Than 14 Weeks With Ob Transvaginal  Result Date: 06/24/2019 CLINICAL DATA:  Vaginal bleeding, cramping EXAM: OBSTETRIC <14 WK [place] AND TRANSVAGINAL OB US TECHNIQUE: Both transabdominal and transvaginal ultrasound examinations were performed for complete evaluation of the gestation as well as the maternal uterus, adnexal regions, and pelvic cul-de-sac. Transvaginal technique was performed to assess early pregnancy. COMPARISON:  05/16/2019 FINDINGS: The previously seen intrauterine gestational sac is no longer identified. The endometrium measures 12 mm in thickness. No areas of internal vascularity within the endometrium. Trace fluid in the endocervical canal. Uterus is otherwise unremarkable. Right ovary is normal in appearance measuring 4.3 x 1.3 x 3.0 cm. Left ovary is normal in appearance measuring 2.9 x 1.5 x 2.1 cm. There is no free fluid within the pelvis. IMPRESSION: Previously seen endometrial sac is no longer identified. No evidence of intrauterine pregnancy. No evidence of retained products of conception. Electronically Signed   By: Davina Poke M.D.   On: 06/24/2019 16:45      Assessment and Plan:    1. SAB (spontaneous abortion) Doing well. All questions answered.  Discussed use of prenatal vitamins, avoiding teratogens given that she want sot try to conceive again.   - Beta hCG quant (ref lab) Routine preventative health maintenance measures emphasized. Please refer to After Visit Summary for other counseling recommendations.   Return for any gynecologic concerns.    Total face-to-face time with patient: 15 minutes.  Over 50%  of encounter was spent on counseling and coordination of care.   Verita Schneiders, MD, [place] for [employer], [place]

## 2019-07-10 LAB — BETA HCG QUANT (REF LAB): hCG Quant: 37 m[IU]/mL

## 2019-07-25 ENCOUNTER — Other Ambulatory Visit (INDEPENDENT_AMBULATORY_CARE_PROVIDER_SITE_OTHER): Payer: 59

## 2019-07-25 ENCOUNTER — Other Ambulatory Visit: Payer: Self-pay

## 2019-07-25 VITALS — BP 118/87 | HR 88

## 2019-07-25 DIAGNOSIS — Z3202 Encounter for pregnancy test, result negative: Secondary | ICD-10-CM | POA: Diagnosis not present

## 2019-07-25 LAB — POCT URINE PREGNANCY: Preg Test, Ur: NEGATIVE

## 2019-07-25 NOTE — Progress Notes (Addendum)
[name] presents today for UPT. She experienced a loss in August and since then has not had a cycle. She an her husband have been trying. She is requesting a pregnancy test today and a hcg level check. Per.Dr.Pickens ok to have an Hcg level since patient is concern. LMP: Approximate May 2020    OBJECTIVE: Appears well, in no apparent distress.  OB History    Gravida  1   Para      Term      Preterm      AB      Living        SAB      TAB      Ectopic      Multiple      Live Births             Home UPT Result: positive  In-Office UPT result: negative  I have reviewed the patient's medical, obstetrical, social, and family histories, and medications.   ASSESSMENT:Negative pregnancy test patient is requesting a HCG level to make sure she is not early pregnant.  PLAN Will follow up with patient once results come back.

## 2019-07-26 LAB — BETA HCG QUANT (REF LAB): hCG Quant: 8 m[IU]/mL

## 2019-08-02 ENCOUNTER — Telehealth: Payer: Self-pay | Admitting: Radiology

## 2019-08-02 NOTE — Telephone Encounter (Signed)
Left message for patient to call [id number] so that we can schedule appointment for Monday 07/1419 for lab and MD appt

## 2019-08-06 ENCOUNTER — Other Ambulatory Visit: Payer: Self-pay

## 2019-08-06 ENCOUNTER — Ambulatory Visit (INDEPENDENT_AMBULATORY_CARE_PROVIDER_SITE_OTHER): Payer: 59

## 2019-08-06 DIAGNOSIS — Z3200 Encounter for pregnancy test, result unknown: Secondary | ICD-10-CM

## 2019-08-06 NOTE — Progress Notes (Signed)
Patient was schedule for appointment today and blood draw this am for HCG level.Patient had a her blood drawn and blood pressure check and stated she had to go to work and did not have time to see the provider to address her concerns. I told the patient we would call her once test results are back.  Patient voice understanding at this time.

## 2019-08-07 LAB — BETA HCG QUANT (REF LAB): hCG Quant: 4 m[IU]/mL

## 2019-09-11 ENCOUNTER — Telehealth: Payer: Self-pay

## 2019-09-11 NOTE — Telephone Encounter (Signed)
Received message from patient stating she has been bleeding for 10 days and she is concern. The bleeding has not been heavy. Patient had a recent loss of pregnancy since then she has been trying to conceive. She took a UPT and it was negative. I have advised patient to continued to monitor her bleeding at this time. If she starts to bleeding and soaking 1 pad per hour she needs to follow up at the ER. I offered patient an appointment and she would like to wait and see what happens in the next week or so. She will call us back to schedule an appointment. I have advised patient to start taking prenatal vitamins as well.

## 2019-09-22 ENCOUNTER — Emergency Department (HOSPITAL_COMMUNITY): Admission: EM | Admit: 2019-09-22 | Discharge: 2019-09-22 | Discharge status: Against Medical Advice | Payer: 59

## 2019-09-22 ENCOUNTER — Other Ambulatory Visit: Payer: Self-pay

## 2019-09-22 NOTE — ED Notes (Signed)
Pt to nurse first desk in lobby stating she would like to leave. Informed pt to return if anything changed

## 2019-11-01 ENCOUNTER — Ambulatory Visit (INDEPENDENT_AMBULATORY_CARE_PROVIDER_SITE_OTHER): Payer: 59 | Admitting: Family

## 2019-11-01 ENCOUNTER — Encounter: Payer: Self-pay | Admitting: Family

## 2019-11-01 VITALS — Ht 66.0 in | Wt 240.0 lb

## 2019-11-01 DIAGNOSIS — Z3169 Encounter for other general counseling and advice on procreation: Secondary | ICD-10-CM | POA: Diagnosis not present

## 2019-11-01 DIAGNOSIS — F4323 Adjustment disorder with mixed anxiety and depressed mood: Secondary | ICD-10-CM | POA: Diagnosis not present

## 2019-11-01 DIAGNOSIS — R109 Unspecified abdominal pain: Secondary | ICD-10-CM | POA: Insufficient documentation

## 2019-11-01 DIAGNOSIS — M545 Low back pain, unspecified: Secondary | ICD-10-CM

## 2019-11-01 MED ORDER — LIDOCAINE 5 % EX PTCH: 1.0000 | MEDICATED_PATCH | CUTANEOUS | 0 refills | Status: DC

## 2019-11-01 NOTE — Assessment & Plan Note (Signed)
Etiology nonspecific at this time, suspect musculoskeletal.  Patient declines coming to office for UA and labs.  At this time we will start with topical modality, lidocaine patch.  Patient will me know if no improvement or new symptoms.

## 2019-11-01 NOTE — Patient Instructions (Addendum)
Please send labs here for review.   Follow up in 2 weeks.

## 2019-11-01 NOTE — Assessment & Plan Note (Addendum)
Suspect miscarriage is understandably contributory.  Patient does have a history of depression, anxiety, and suicide attempt.  She denies any suicidal or homicidal ideation today.  She has no suicide plan.  At this time we jointly agreed we would start with counseling.  Very close follow-up.

## 2019-11-01 NOTE — Assessment & Plan Note (Signed)
Advised PNV.  Referral placed to OB/GYN.  Will follow

## 2019-11-01 NOTE — Progress Notes (Signed)
Virtual Visit via Video Note  I connected with@  on 11/01/19 at 11:30 AM EST by a video enabled telemedicine application and verified that I am speaking with the correct person using two identifiers.  Location patient: home Location provider:work  Persons participating in the virtual visit: patient, provider  I discussed the limitations of evaluation and management by telemedicine and the availability of in person appointments. The patient expressed understanding and agreed to proceed.  Interactive audio and video telecommunications were attempted between this provider and patient, however failed, due to patient having technical difficulties or patient did not have access to video capability.  We continued and completed visit with audio only.    HPI: Appointment to establish care. No recent prior PCP. Lives in [place], [place].  Married. Established with OB GYN in [place].  Complains of low back pain started yesterday, waxes and wanes. Feels more low right sided. Feels 'muscle spasm'. No rash, fever, dysuria, dysuria,nausea, vomiting, numbness. Worse if laying down, or getting dressed will cause pain.  No injury or heavy lifting. Tried stretching , IcyHot without relief . Describes as an ache.   H/o of 'high' anxiety, depression and trouble sleeping which has been chronic for her. Would like referral to therapy. No suicide ideation. Attempted suicide with overdose with narcotic in 2015. No current suicide plan or HI.  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 Since miscarriage 05/2019 , she has felt 'obsessive' about pregnancy and has taken repeated tests.  Miscarriage at 13 weeks, 05/2019.  Currently trying to get pregnant. Would like new OB referral On PNV LMP: 10/02/2019. Negative pregnant test today.    ROS: See pertinent positives and negatives per HPI.  Past Medical History:  Diagnosis Date  . Anxiety   . Depression   . Deviated septum   . GERD (gastroesophageal reflux disease)   .  Headache     Past Surgical History:  Procedure Laterality Date  . BREAST REDUCTION SURGERY    . NASAL SEPTUM SURGERY    . TONSILLECTOMY    . WISDOM TOOTH EXTRACTION      Family History  Problem Relation Age of Onset  . Heart attack Father     SOCIAL HX: non smoker No drug use  Current Outpatient Medications:  .  prenatal vitamin w/FE, FA (PRENATAL 1 + 1) 27-1 MG TABS tablet, Take 1 tablet by mouth daily at 12 noon., Disp: 30 each, Rfl: 12 .  lidocaine (LIDODERM) 5 %, Place 1 patch onto the skin daily. Remove & Discard patch within 12 hours., Disp: 30 patch, Rfl: 0  EXAM:  VITALS per patient if applicable:  GENERAL: alert, oriented, appears well and in no acute distress  HEENT: atraumatic, conjunttiva clear, no obvious abnormalities on inspection of external nose and ears  NECK: normal movements of the head and neck  LUNGS: on inspection no signs of respiratory distress, breathing rate appears normal, no obvious gross SOB, gasping or wheezing  CV: no obvious cyanosis  MS: moves all visible extremities without noticeable abnormality  PSYCH/NEURO: pleasant and cooperative, no obvious depression or anxiety, speech and thought processing grossly intact  ASSESSMENT AND PLAN:  Discussed the following assessment and plan:  Problem List Items Addressed This Visit      Other   Adjustment reaction with anxiety and depression    Suspect miscarriage is understandably contributory.  Patient does have a history of depression, anxiety, and suicide attempt.  She denies any suicidal or homicidal ideation today.  She has no  suicide plan.  At this time we jointly agreed we would start with counseling.  Very close follow-up.      Low back pain - Primary    Etiology nonspecific at this time, suspect musculoskeletal.  Patient declines coming to office for UA and labs.  At this time we will start with topical modality, lidocaine patch.  Patient will me know if no improvement or new  symptoms.      Relevant Medications   lidocaine (LIDODERM) 5 %   Other Relevant Orders   Ambulatory referral to Obstetrics / Gynecology    counseling- Ambulatory referral to Psychology   Encounter for preconception consultation    Advised PNV.  Referral placed to OB/GYN.  Will follow         -we discussed possible serious and likely etiologies, options for evaluation and workup, limitations of telemedicine visit vs in person visit, treatment, treatment risks and precautions. Pt prefers to treat via telemedicine empirically rather then risking or undertaking an in person visit at this moment. Patient agrees to seek prompt in person care if worsening, new symptoms arise, or if is not improving with treatment.   I discussed the assessment and treatment plan with the patient. The patient was provided an opportunity to ask questions and all were answered. The patient agreed with the plan and demonstrated an understanding of the instructions.   The patient was advised to call back or seek an in-person evaluation if the symptoms worsen or if the condition fails to improve as anticipated.   Mable Paris, FNP

## 2019-11-03 ENCOUNTER — Emergency Department (HOSPITAL_COMMUNITY)
Admission: EM | Admit: 2019-11-03 | Discharge: 2019-11-03 | Disposition: A | Discharge status: Home / Self Care | Payer: 59 | Attending: Emergency Medicine | Admitting: Emergency Medicine

## 2019-11-03 ENCOUNTER — Other Ambulatory Visit: Payer: Self-pay

## 2019-11-03 ENCOUNTER — Emergency Department (HOSPITAL_COMMUNITY): Payer: 59

## 2019-11-03 ENCOUNTER — Encounter (HOSPITAL_COMMUNITY): Payer: Self-pay | Admitting: Emergency Medicine

## 2019-11-03 DIAGNOSIS — R350 Frequency of micturition: Secondary | ICD-10-CM | POA: Diagnosis not present

## 2019-11-03 DIAGNOSIS — M545 Low back pain, unspecified: Secondary | ICD-10-CM

## 2019-11-03 DIAGNOSIS — U071 COVID-19: Secondary | ICD-10-CM | POA: Diagnosis not present

## 2019-11-03 DIAGNOSIS — N760 Acute vaginitis: Secondary | ICD-10-CM | POA: Diagnosis not present

## 2019-11-03 DIAGNOSIS — E278 Other specified disorders of adrenal gland: Secondary | ICD-10-CM

## 2019-11-03 DIAGNOSIS — Z79899 Other long term (current) drug therapy: Secondary | ICD-10-CM | POA: Insufficient documentation

## 2019-11-03 DIAGNOSIS — R1031 Right lower quadrant pain: Secondary | ICD-10-CM | POA: Diagnosis present

## 2019-11-03 DIAGNOSIS — B9689 Other specified bacterial agents as the cause of diseases classified elsewhere: Secondary | ICD-10-CM

## 2019-11-03 DIAGNOSIS — R509 Fever, unspecified: Secondary | ICD-10-CM | POA: Insufficient documentation

## 2019-11-03 LAB — COMPREHENSIVE METABOLIC PANEL
ALT: 33 U/L (ref 0–44)
AST: 29 U/L (ref 15–41)
Albumin: 4.3 g/dL (ref 3.5–5.0)
Alkaline Phosphatase: 53 U/L (ref 38–126)
Anion gap: 14 (ref 5–15)
BUN: 11 mg/dL (ref 6–20)
CO2: 23 mmol/L (ref 22–32)
Calcium: 9.4 mg/dL (ref 8.9–10.3)
Chloride: 100 mmol/L (ref 98–111)
Creatinine, Ser: 1.01 mg/dL — ABNORMAL HIGH (ref 0.44–1.00)
GFR calc Af Amer: >60 mL/min (ref >60)
GFR calc non Af Amer: >60 mL/min (ref >60)
Glucose, Bld: 82 mg/dL (ref 70–99)
Potassium: 3.4 mmol/L — ABNORMAL LOW (ref 3.5–5.1)
Sodium: 137 mmol/L (ref 135–145)
Total Bilirubin: 0.4 mg/dL (ref 0.3–1.2)
Total Protein: 8.1 g/dL (ref 6.5–8.1)

## 2019-11-03 LAB — CBC WITH DIFFERENTIAL/PLATELET
Abs Immature Granulocytes: 0 10*3/uL (ref 0.00–0.07)
Basophils Absolute: 0 10*3/uL (ref 0.0–0.1)
Basophils Relative: 0 %
Eosinophils Absolute: 0 10*3/uL (ref 0.0–0.5)
Eosinophils Relative: 1 %
HCT: 38.8 % (ref 36.0–46.0)
Hemoglobin: 12.4 g/dL (ref 12.0–15.0)
Immature Granulocytes: 0 %
Lymphocytes Relative: 12 %
Lymphs Abs: 0.7 10*3/uL (ref 0.7–4.0)
MCH: 26.2 pg (ref 26.0–34.0)
MCHC: 32 g/dL (ref 30.0–36.0)
MCV: 82 fL (ref 80.0–100.0)
Monocytes Absolute: 0.5 10*3/uL (ref 0.1–1.0)
Monocytes Relative: 9 %
Neutro Abs: 4.5 10*3/uL (ref 1.7–7.7)
Neutrophils Relative %: 78 %
Platelets: 302 10*3/uL (ref 150–400)
RBC: 4.73 MIL/uL (ref 3.87–5.11)
RDW: 15.9 % — ABNORMAL HIGH (ref 11.5–15.5)
WBC: 5.8 10*3/uL (ref 4.0–10.5)
nRBC: 0 % (ref 0.0–0.2)

## 2019-11-03 LAB — URINALYSIS, ROUTINE W REFLEX MICROSCOPIC
Bilirubin Urine: NEGATIVE
Glucose, UA: NEGATIVE mg/dL
Hgb urine dipstick: NEGATIVE
Ketones, ur: NEGATIVE mg/dL
Nitrite: NEGATIVE
Protein, ur: 30 mg/dL — AB
Specific Gravity, Urine: 1.024 (ref 1.005–1.030)
pH: 5 (ref 5.0–8.0)

## 2019-11-03 LAB — WET PREP, GENITAL
Sperm: NONE SEEN
Trich, Wet Prep: NONE SEEN
Yeast Wet Prep HPF POC: NONE SEEN

## 2019-11-03 LAB — POC SARS CORONAVIRUS 2 AG -  ED: SARS Coronavirus 2 Ag: NEGATIVE

## 2019-11-03 LAB — PREGNANCY, URINE: Preg Test, Ur: NEGATIVE

## 2019-11-03 MED ORDER — IOHEXOL 300 MG/ML  SOLN
100.0000 mL | Freq: Once | INTRAMUSCULAR | Status: AC | PRN
  Administered 2019-11-03: 100 mL via INTRAVENOUS

## 2019-11-03 MED ORDER — ACETAMINOPHEN 325 MG PO TABS
650.0000 mg | ORAL_TABLET | Freq: Once | ORAL | Status: AC
  Administered 2019-11-03: 650 mg via ORAL
  Filled 2019-11-03: qty 2

## 2019-11-03 MED ORDER — HYDROCODONE-ACETAMINOPHEN 5-325 MG PO TABS
1.0000 | ORAL_TABLET | Freq: Once | ORAL | Status: AC
  Administered 2019-11-03: 1 via ORAL
  Filled 2019-11-03: qty 1

## 2019-11-03 MED ORDER — METRONIDAZOLE 500 MG PO TABS
500.0000 mg | ORAL_TABLET | Freq: Once | ORAL | Status: AC
  Administered 2019-11-03: 500 mg via ORAL
  Filled 2019-11-03: qty 1

## 2019-11-03 MED ORDER — METRONIDAZOLE 500 MG PO TABS: 500.0000 mg | ORAL_TABLET | Freq: Two times a day (BID) | ORAL | 0 refills | Status: DC

## 2019-11-03 MED ORDER — HYDROCODONE-ACETAMINOPHEN 5-325 MG PO TABS: 1.0000 | ORAL_TABLET | ORAL | 0 refills | Status: DC | PRN

## 2019-11-03 NOTE — ED Triage Notes (Signed)
Pt reports back pain since Thursday. Pt reports some burning with urination.

## 2019-11-03 NOTE — Discharge Instructions (Addendum)
You are being treated for bacterial vaginosis with the flagyl - take your next dose tomorrow morning.  As discussed, you have a mass at your left adrenal gland which is pushing against your kidney and pancreas.  This does not explain the pain in your right lower back, unless this is "referred" pain from this location.  It is recommended that you have a contrast enhanced MRI to better determine the character of this mass.  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  Call your primary provider to arrange this for you.  You may take the hydrocodone prescribed for pain relief.  This will make you drowsy - do not drive within 4 hours of taking this medication.

## 2019-11-03 NOTE — ED Provider Notes (Signed)
[facility] EMERGENCY DEPARTMENT Provider Note   CSN: [id number] Arrival date & time: 11/03/19  1413     History Chief Complaint  Patient presents with  . Back Pain    Terri Cochran is a 28 y.o. female with no significant past medical history, although she and her husband are currently undergoing infertility treatment which she last completed November 30, presenting with right pain which radiates into her right lateral lower abdomen, worsening, reporting increased urinary frequency, white watery vaginal discharge, and fever which started yesterday, T-max 101.5.  She also endorses poor appetite and has had no p.o. intake since yesterday.  She is nauseated without vomiting, denies diarrhea or constipation.  Her pain is worsened with movement and certain positions such as lying on her right side.  She denies any risk factors for STDs.  She has increased urination but denies dysuria.  She has had no medications for her symptoms, she was given Tylenol upon arrival here.  HPI     Past Medical History:  Diagnosis Date  . Anxiety   . Depression   . Deviated septum   . GERD (gastroesophageal reflux disease)   . Headache     Patient Active Problem List   Diagnosis Date Noted  . Adjustment reaction with anxiety and depression 11/01/2019  . Low back pain 11/01/2019  . Encounter for preconception consultation 11/01/2019    Past Surgical History:  Procedure Laterality Date  . BREAST REDUCTION SURGERY    . NASAL SEPTUM SURGERY    . TONSILLECTOMY    . WISDOM TOOTH EXTRACTION       OB History    Gravida  1   Para      Term      Preterm      AB      Living        SAB      TAB      Ectopic      Multiple      Live Births              Family History  Problem Relation Age of Onset  . Heart attack Father     Social History   Tobacco Use  . Smoking status: Never Smoker  . Smokeless tobacco: Never Used  Substance Use Topics  . Alcohol use: Not Currently   . Drug use: Not Currently    Home Medications Prior to Admission medications   Medication Sig Start Date End Date Taking? Authorizing Provider  lidocaine (LIDODERM) 5 % Place 1 patch onto the skin daily. Remove & Discard patch within 12 hours. 11/01/19  Yes ArnettYvetta Coder, FNP  HYDROcodone-acetaminophen (NORCO/VICODIN) 5-325 MG tablet Take 1 tablet by mouth every 4 (four) hours as needed. 11/03/19   Evalee Jefferson, PA-C  metroNIDAZOLE (FLAGYL) 500 MG tablet Take 1 tablet (500 mg total) by mouth 2 (two) times daily. 11/03/19   Evalee Jefferson, PA-C  prenatal vitamin w/FE, FA (PRENATAL 1 + 1) 27-1 MG TABS tablet Take 1 tablet by mouth daily at 12 noon. 05/01/19   Estill Dooms, NP    Allergies    Patient has no known allergies.  Review of Systems   Review of Systems  Constitutional: Positive for fever.  HENT: Positive for congestion. Negative for sore throat.        Chronic congestion, not worsened today.   Eyes: Negative.   Respiratory: Negative for chest tightness and shortness of breath.   Cardiovascular: Negative for chest pain.  Gastrointestinal: Positive for abdominal pain and nausea. Negative for vomiting.  Genitourinary: Positive for frequency. Negative for dysuria and hematuria.  Musculoskeletal: Positive for myalgias. Negative for arthralgias, joint swelling and neck pain.  Skin: Negative.  Negative for rash and wound.  Neurological: Negative for dizziness, weakness, light-headedness, numbness and headaches.  Psychiatric/Behavioral: Negative.     Physical Exam Updated Vital Signs BP 108/88 (BP Location: Right Arm)   Pulse 100   Temp 98.9 F (37.2 C) (Oral)   Resp 20   Ht 5\' 6"  (1.676 m)   Wt 108.9 kg   LMP 10/02/2019   SpO2 100%   BMI 38.74 kg/m   Physical Exam Vitals and nursing note reviewed.  Constitutional:      Appearance: She is well-developed.  HENT:     Head: Normocephalic and atraumatic.  Eyes:     Conjunctiva/sclera: Conjunctivae normal.   Cardiovascular:     Rate and Rhythm: Regular rhythm. Tachycardia present.     Heart sounds: Normal heart sounds.  Pulmonary:     Effort: Pulmonary effort is normal.     Breath sounds: Normal breath sounds. No wheezing.  Abdominal:     General: Bowel sounds are normal.     Palpations: Abdomen is soft.     Tenderness: There is abdominal tenderness. There is no right CVA tenderness, left CVA tenderness or guarding.       Comments: Radiating pain into her right lower quadrant with palpation along her left abdominal quadrants.  Musculoskeletal:        General: Normal range of motion.     Cervical back: Normal range of motion.  Skin:    General: Skin is warm and dry.  Neurological:     Mental Status: She is alert.     ED Results / Procedures / Treatments   Labs (all labs ordered are listed, but only abnormal results are displayed) Labs Reviewed  WET PREP, GENITAL - Abnormal; Notable for the following components:      Result Value   Clue Cells Wet Prep HPF POC PRESENT (*)    WBC, Wet Prep HPF POC FEW (*)    All other components within normal limits  URINALYSIS, ROUTINE W REFLEX MICROSCOPIC - Abnormal; Notable for the following components:   APPearance HAZY (*)    Protein, ur 30 (*)    Leukocytes,Ua TRACE (*)    Bacteria, UA FEW (*)    All other components within normal limits  CBC WITH DIFFERENTIAL/PLATELET - Abnormal; Notable for the following components:   RDW 15.9 (*)    All other components within normal limits  COMPREHENSIVE METABOLIC PANEL - Abnormal; Notable for the following components:   Potassium 3.4 (*)    Creatinine, Ser 1.01 (*)    All other components within normal limits  PREGNANCY, URINE  POC SARS CORONAVIRUS 2 AG -  ED  GC/CHLAMYDIA PROBE AMP ([place]) NOT AT [facility]    EKG None  Radiology CT ABDOMEN PELVIS W CONTRAST  Result Date: 11/03/2019 CLINICAL DATA:  Right lower quadrant abdominal pain with concern for appendicitis. EXAM: CT ABDOMEN AND  PELVIS WITH CONTRAST TECHNIQUE: Multidetector CT imaging of the abdomen and pelvis was performed using the standard protocol following bolus administration of intravenous contrast. CONTRAST:  129mL OMNIPAQUE IOHEXOL 300 MG/ML  SOLN COMPARISON:  None. FINDINGS: Lower chest: The lung bases are clear. The heart size is normal. Hepatobiliary: The liver is normal. Normal gallbladder.There is no biliary ductal dilation. Pancreas: Normal contours without ductal dilatation. No  peripancreatic fluid collection. Spleen: No splenic laceration or hematoma. Adrenals/Urinary Tract: --Adrenal glands: The right adrenal gland is unremarkable. In the region of the left adrenal gland there is a complex primarily fat containing lesion measuring approximately 12.8 x 7.2 by 9.6 cm. This mass contains areas of calcification and soft tissue including areas with fat fluid levels. There is mass effect on the left kidney and pancreas. --Right kidney/ureter: No hydronephrosis or perinephric hematoma. --Left kidney/ureter: There is a nonobstructing stone in the interpolar region left kidney. --Urinary bladder: Unremarkable. Stomach/Bowel: --Stomach/Duodenum: No hiatal hernia or other gastric abnormality. Normal duodenal course and caliber. --Small bowel: No dilatation or inflammation. --Colon: No focal abnormality. --Appendix: Normal. Vascular/Lymphatic: Normal course and caliber of the major abdominal vessels. --No retroperitoneal lymphadenopathy. --No mesenteric lymphadenopathy. --No pelvic or inguinal lymphadenopathy. Reproductive: Unremarkable Other: No ascites or free air. The abdominal wall is normal. Musculoskeletal. No acute displaced fractures. IMPRESSION: 1. No acute abdominopelvic abnormality. Specifically, the appendix is normal. 2. There is a complex primarily fat containing lesion that appears to be centered in the left adrenal gland measuring up to 12.8 cm, containing areas of calcification and soft tissue. There is mass effect on  the left kidney and pancreas. 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 However, given its complexity, an outpatient nonemergent contrast enhanced MRI is recommended for further evaluation. 3. Nonobstructive left nephrolithiasis. Electronically Signed   By: Constance Holster M.D.   On: 11/03/2019 19:44    Procedures Procedures (including critical care time)  Medications Ordered in ED Medications  metroNIDAZOLE (FLAGYL) tablet 500 mg (has no administration in time range)  HYDROcodone-acetaminophen (NORCO/VICODIN) 5-325 MG per tablet 1 tablet (has no administration in time range)  acetaminophen (TYLENOL) tablet 650 mg (650 mg Oral Given 11/03/19 1504)  iohexol (OMNIPAQUE) 300 MG/ML solution 100 mL (100 mLs Intravenous Contrast Given 11/03/19 1918)    ED Course  I have reviewed the triage vital signs and the nursing notes.  Pertinent labs & imaging results that were available during my care of the patient were reviewed by me and considered in my medical decision making (see chart for details).    MDM Rules/Calculators/A&P                      Labs and imaging reviewed, pt with bacterial vaginosis which explains her vaginal dc, gc/chlamydia pending, denies risk factors for stds, doubt this as source of sx. Rapid covid negative, confirmatory test completed.  Discussed Ct findings of left adrenal mass.  She understands to discuss with pcp for arranging MRI for further evaluation of this finding.  Discussed home isolation until Covid test results and is negative given this febrile illness.  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

## 2019-11-04 ENCOUNTER — Encounter: Payer: Self-pay | Admitting: Family

## 2019-11-04 ENCOUNTER — Telehealth: Payer: Self-pay

## 2019-11-04 ENCOUNTER — Emergency Department (HOSPITAL_COMMUNITY)
Admission: EM | Admit: 2019-11-04 | Discharge: 2019-11-04 | Disposition: A | Discharge status: Home / Self Care | Payer: 59 | Attending: Emergency Medicine | Admitting: Emergency Medicine

## 2019-11-04 ENCOUNTER — Encounter (HOSPITAL_COMMUNITY): Payer: Self-pay | Admitting: Emergency Medicine

## 2019-11-04 ENCOUNTER — Other Ambulatory Visit: Payer: Self-pay

## 2019-11-04 ENCOUNTER — Emergency Department (HOSPITAL_COMMUNITY): Payer: 59

## 2019-11-04 DIAGNOSIS — Z79899 Other long term (current) drug therapy: Secondary | ICD-10-CM | POA: Diagnosis not present

## 2019-11-04 DIAGNOSIS — R509 Fever, unspecified: Secondary | ICD-10-CM | POA: Diagnosis present

## 2019-11-04 DIAGNOSIS — B349 Viral infection, unspecified: Secondary | ICD-10-CM | POA: Insufficient documentation

## 2019-11-04 DIAGNOSIS — E278 Other specified disorders of adrenal gland: Secondary | ICD-10-CM

## 2019-11-04 LAB — BASIC METABOLIC PANEL
Anion gap: 7 (ref 5–15)
BUN: 17 mg/dL (ref 6–20)
CO2: 24 mmol/L (ref 22–32)
Calcium: 9.2 mg/dL (ref 8.9–10.3)
Chloride: 105 mmol/L (ref 98–111)
Creatinine, Ser: 1.08 mg/dL — ABNORMAL HIGH (ref 0.44–1.00)
GFR calc Af Amer: >60 mL/min (ref >60)
GFR calc non Af Amer: >60 mL/min (ref >60)
Glucose, Bld: 100 mg/dL — ABNORMAL HIGH (ref 70–99)
Potassium: 3.4 mmol/L — ABNORMAL LOW (ref 3.5–5.1)
Sodium: 136 mmol/L (ref 135–145)

## 2019-11-04 LAB — URINALYSIS, ROUTINE W REFLEX MICROSCOPIC
Bilirubin Urine: NEGATIVE
Glucose, UA: NEGATIVE mg/dL
Hgb urine dipstick: NEGATIVE
Ketones, ur: 5 mg/dL — AB
Nitrite: NEGATIVE
Protein, ur: 30 mg/dL — AB
Specific Gravity, Urine: 1.032 — ABNORMAL HIGH (ref 1.005–1.030)
pH: 5 (ref 5.0–8.0)

## 2019-11-04 LAB — CBC WITH DIFFERENTIAL/PLATELET
Abs Immature Granulocytes: 0.01 10*3/uL (ref 0.00–0.07)
Basophils Absolute: 0 10*3/uL (ref 0.0–0.1)
Basophils Relative: 1 %
Eosinophils Absolute: 0 10*3/uL (ref 0.0–0.5)
Eosinophils Relative: 1 %
HCT: 38 % (ref 36.0–46.0)
Hemoglobin: 12.2 g/dL (ref 12.0–15.0)
Immature Granulocytes: 0 %
Lymphocytes Relative: 30 %
Lymphs Abs: 1.3 10*3/uL (ref 0.7–4.0)
MCH: 26.5 pg (ref 26.0–34.0)
MCHC: 32.1 g/dL (ref 30.0–36.0)
MCV: 82.4 fL (ref 80.0–100.0)
Monocytes Absolute: 0.7 10*3/uL (ref 0.1–1.0)
Monocytes Relative: 16 %
Neutro Abs: 2.3 10*3/uL (ref 1.7–7.7)
Neutrophils Relative %: 52 %
Platelets: 263 10*3/uL (ref 150–400)
RBC: 4.61 MIL/uL (ref 3.87–5.11)
RDW: 15.9 % — ABNORMAL HIGH (ref 11.5–15.5)
WBC: 4.3 10*3/uL (ref 4.0–10.5)
nRBC: 0 % (ref 0.0–0.2)

## 2019-11-04 LAB — TSH: TSH: 1.384 u[IU]/mL (ref 0.350–4.500)

## 2019-11-04 LAB — PREGNANCY, URINE: Preg Test, Ur: NEGATIVE

## 2019-11-04 LAB — TROPONIN I (HIGH SENSITIVITY): Troponin I (High Sensitivity): 3 ng/L (ref <18)

## 2019-11-04 LAB — D-DIMER, QUANTITATIVE: D-Dimer, Quant: 0.42 ug/mL-FEU (ref 0.00–0.50)

## 2019-11-04 MED ORDER — IBUPROFEN 800 MG PO TABS: 800.0000 mg | ORAL_TABLET | Freq: Three times a day (TID) | ORAL | 0 refills | Status: DC

## 2019-11-04 MED ORDER — CEPHALEXIN 500 MG PO CAPS: 500.0000 mg | ORAL_CAPSULE | Freq: Two times a day (BID) | ORAL | 0 refills | Status: DC

## 2019-11-04 MED ORDER — FAMOTIDINE 20 MG PO TABS
20.0000 mg | ORAL_TABLET | Freq: Once | ORAL | Status: AC
  Administered 2019-11-04: 20 mg via ORAL
  Filled 2019-11-04: qty 1

## 2019-11-04 MED ORDER — ONDANSETRON 4 MG PO TBDP: 4.0000 mg | ORAL_TABLET | Freq: Three times a day (TID) | ORAL | 0 refills | Status: DC | PRN

## 2019-11-04 MED ORDER — IBUPROFEN 800 MG PO TABS
800.0000 mg | ORAL_TABLET | Freq: Once | ORAL | Status: AC
  Administered 2019-11-04: 800 mg via ORAL
  Filled 2019-11-04: qty 1

## 2019-11-04 MED ORDER — CEPHALEXIN 500 MG PO CAPS
500.0000 mg | ORAL_CAPSULE | Freq: Once | ORAL | Status: AC
  Administered 2019-11-04: 500 mg via ORAL
  Filled 2019-11-04: qty 1

## 2019-11-04 MED ORDER — ONDANSETRON 4 MG PO TBDP
4.0000 mg | ORAL_TABLET | Freq: Once | ORAL | Status: AC
  Administered 2019-11-04: 4 mg via ORAL
  Filled 2019-11-04: qty 1

## 2019-11-04 NOTE — ED Provider Notes (Signed)
[facility] Provider Note   CSN: [id number] Arrival date & time: 11/04/19  1559     History Chief Complaint  Patient presents with  . Fever    Terri Cochran is a 28 y.o. 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  She presents today for worsening symptoms including vomiting in association with the nausea also has had multiple episodes of diarrhea today.  She also reports new onset of cough which has been productive of yellow sputum production, she denies shortness of breath but endorses midsternal chest pain which is constant but is worsened with coughing and deep inspiration.  Initially the pain was right-sided but now has migrated to mid chest location.  She is also had fever which is unchanged from her initial evaluation.  Her rapid Covid test yesterday was negative, her send out confirmatory test has not resulted yet.  She has had no medications prior to arrival.  She did try to take her Flagyl tablet this morning but she promptly vomited.  The history is provided by the patient.       Past Medical History:  Diagnosis Date  . Anxiety   . Depression   . Deviated septum   . GERD (gastroesophageal reflux disease)   . Headache     Patient Active Problem List   Diagnosis Date Noted  . Adjustment reaction with anxiety and depression 11/01/2019  . Low back pain 11/01/2019  . Encounter for preconception consultation 11/01/2019    Past Surgical History:  Procedure Laterality Date  . BREAST REDUCTION SURGERY    . NASAL SEPTUM SURGERY    . TONSILLECTOMY    . WISDOM TOOTH EXTRACTION        OB History    Gravida  1   Para      Term      Preterm      AB      Living        SAB      TAB      Ectopic      Multiple      Live Births              Family History  Problem Relation Age of Onset  . Heart attack Father     Social History   Tobacco Use  . Smoking status: Never Smoker  . Smokeless tobacco: Never Used  Substance Use Topics  . Alcohol use: Not Currently  . Drug use: Not Currently    Home Medications Prior to Admission medications   Medication Sig Start Date End Date Taking? Authorizing Provider  HYDROcodone-acetaminophen (NORCO/VICODIN) 5-325 MG tablet Take 1 tablet by mouth every 4 (four) hours as needed. 11/03/19  Yes Camara Renstrom, Almyra Free, PA-C  lidocaine (LIDODERM) 5 % Place 1 patch onto the skin daily. Remove & Discard patch within 12 hours. 11/01/19  Yes Arnett, Yvetta Coder, FNP  metroNIDAZOLE (FLAGYL) 500 MG tablet Take 1 tablet (500 mg total) by mouth 2 (two) times daily. 11/03/19  Yes Anayi Bricco, Almyra Free, PA-C  prenatal vitamin w/FE, FA (PRENATAL 1 + 1) 27-1 MG TABS tablet Take 1 tablet by mouth daily at 12  noon. 05/01/19  Yes Derrek Monaco A, NP  cephALEXin (KEFLEX) 500 MG capsule Take 1 capsule (500 mg total) by mouth 2 (two) times daily. 11/04/19   Evalee Jefferson, PA-C  ibuprofen (ADVIL) 800 MG tablet Take 1 tablet (800 mg total) by mouth 3 (three) times daily. 11/04/19   Evalee Jefferson, PA-C  ondansetron (ZOFRAN ODT) 4 MG disintegrating tablet Take 1 tablet (4 mg total) by mouth every 8 (eight) hours as needed for nausea or vomiting. 11/04/19   Evalee Jefferson, PA-C    Allergies    Patient has no known allergies.  Review of Systems   Review of Systems  Constitutional: Positive for fever.  HENT: Positive for congestion. Negative for sore throat.        Chronic sinus congestion.  History of septoplasty.  Eyes: Negative.   Respiratory: Positive for cough. Negative for chest tightness and shortness of breath.   Cardiovascular: Positive for chest  pain. Negative for palpitations and leg swelling.  Gastrointestinal: Positive for diarrhea, nausea and vomiting. Negative for abdominal pain.  Genitourinary: Negative.   Musculoskeletal: Positive for back pain and myalgias. Negative for arthralgias, joint swelling and neck pain.  Skin: Negative.  Negative for rash and wound.  Neurological: Negative for dizziness, weakness, light-headedness, numbness and headaches.  Psychiatric/Behavioral: Negative.     Physical Exam Updated Vital Signs BP 118/77 (BP Location: Right Arm)   Pulse 77   Temp 98.3 F (36.8 C) (Oral)   Resp 16   Ht 5\' 6"  (1.676 m)   Wt 109 kg   LMP 10/02/2019   SpO2 98%   BMI 38.79 kg/m   Physical Exam Vitals and nursing note reviewed.  Constitutional:      General: She is not in acute distress.    Appearance: She is well-developed.     Comments: Anxious.  HENT:     Head: Normocephalic and atraumatic.  Eyes:     Conjunctiva/sclera: Conjunctivae normal.  Cardiovascular:     Rate and Rhythm: Normal rate and regular rhythm.     Heart sounds: Normal heart sounds.  Pulmonary:     Effort: Pulmonary effort is normal. No respiratory distress.     Breath sounds: Normal breath sounds. No wheezing or rhonchi.  Chest:     Chest wall: No tenderness.  Abdominal:     General: Bowel sounds are normal.     Palpations: Abdomen is soft.     Tenderness: There is no abdominal tenderness.  Musculoskeletal:        General: Normal range of motion.     Cervical back: Normal range of motion.  Skin:    General: Skin is warm and dry.  Neurological:     General: No focal deficit present.     Mental Status: She is alert.     ED Results / Procedures / Treatments   Labs (all labs ordered are listed, but only abnormal results are displayed) Labs Reviewed  URINALYSIS, ROUTINE W REFLEX MICROSCOPIC - Abnormal; Notable for the following components:      Result Value   Color, Urine AMBER (*)    APPearance CLOUDY (*)    Specific  Gravity, Urine 1.032 (*)    Ketones, ur 5 (*)    Protein, ur 30 (*)    Leukocytes,Ua MODERATE (*)    Bacteria, UA RARE (*)    All other components within normal limits  CBC WITH DIFFERENTIAL/PLATELET - Abnormal; Notable for the following components:   RDW 15.9 (*)    All  other components within normal limits  BASIC METABOLIC PANEL - Abnormal; Notable for the following components:   Potassium 3.4 (*)    Glucose, Bld 100 (*)    Creatinine, Ser 1.08 (*)    All other components within normal limits  URINE CULTURE  D-DIMER, QUANTITATIVE (NOT AT [facility])  PREGNANCY, URINE  TSH  TROPONIN I (HIGH SENSITIVITY)    EKG EKG Interpretation  Date/Time:  Monday November 04 2019 18:33:57 EST Ventricular Rate:  93 PR Interval:  168 QRS Duration: 62 QT Interval:  346 QTC Calculation: 430 R Axis:   71 Text Interpretation: Normal sinus rhythm Normal ECG Confirmed by Veryl Speak [phone]) on 11/04/2019 6:55:52 PM   Radiology Results for orders placed or performed during the hospital encounter of 11/04/19  Urinalysis, Routine w reflex microscopic  Result Value Ref Range   Color, Urine AMBER (A) YELLOW   APPearance CLOUDY (A) CLEAR   Specific Gravity, Urine 1.032 (H) 1.005 - 1.030   pH 5.0 5.0 - 8.0   Glucose, UA NEGATIVE NEGATIVE mg/dL   Hgb urine dipstick NEGATIVE NEGATIVE   Bilirubin Urine NEGATIVE NEGATIVE   Ketones, ur 5 (A) NEGATIVE mg/dL   Protein, ur 30 (A) NEGATIVE mg/dL   Nitrite NEGATIVE NEGATIVE   Leukocytes,Ua MODERATE (A) NEGATIVE   RBC / HPF 6-10 0 - 5 RBC/hpf   WBC, UA 6-10 0 - 5 WBC/hpf   Bacteria, UA RARE (A) NONE SEEN   Squamous Epithelial / LPF 21-50 0 - 5   Mucus PRESENT    Ca Oxalate Crys, UA PRESENT   D-dimer, quantitative (not at [facility])  Result Value Ref Range   D-Dimer, Quant 0.42 0.00 - 0.50 ug/mL-FEU  CBC with Differential  Result Value Ref Range   WBC 4.3 4.0 - 10.5 K/uL   RBC 4.61 3.87 - 5.11 MIL/uL   Hemoglobin 12.2 12.0 - 15.0 g/dL   HCT 38.0 36.0 -  46.0 %   MCV 82.4 80.0 - 100.0 fL   MCH 26.5 26.0 - 34.0 pg   MCHC 32.1 30.0 - 36.0 g/dL   RDW 15.9 (H) 11.5 - 15.5 %   Platelets 263 150 - 400 K/uL   nRBC 0.0 0.0 - 0.2 %   Neutrophils Relative % 52 %   Neutro Abs 2.3 1.7 - 7.7 K/uL   Lymphocytes Relative 30 %   Lymphs Abs 1.3 0.7 - 4.0 K/uL   Monocytes Relative 16 %   Monocytes Absolute 0.7 0.1 - 1.0 K/uL   Eosinophils Relative 1 %   Eosinophils Absolute 0.0 0.0 - 0.5 K/uL   Basophils Relative 1 %   Basophils Absolute 0.0 0.0 - 0.1 K/uL   Immature Granulocytes 0 %   Abs Immature Granulocytes 0.01 0.00 - 0.07 K/uL  Basic metabolic panel  Result Value Ref Range   Sodium 136 135 - 145 mmol/L   Potassium 3.4 (L) 3.5 - 5.1 mmol/L   Chloride 105 98 - 111 mmol/L   CO2 24 22 - 32 mmol/L   Glucose, Bld 100 (H) 70 - 99 mg/dL   BUN 17 6 - 20 mg/dL   Creatinine, Ser 1.08 (H) 0.44 - 1.00 mg/dL   Calcium 9.2 8.9 - 10.3 mg/dL   GFR calc non Af Amer >60 >60 mL/min   GFR calc Af Amer >60 >60 mL/min   Anion gap 7 5 - 15  Pregnancy, urine  Result Value Ref Range   Preg Test, Ur NEGATIVE NEGATIVE  TSH  Result Value Ref Range  TSH 1.384 0.350 - 4.500 uIU/mL  Troponin I (High Sensitivity)  Result Value Ref Range   Troponin I (High Sensitivity) 3 <18 ng/L   DG Chest Portable 1 View  Result Date: 11/04/2019 CLINICAL DATA:  Cough, chest pain EXAM: PORTABLE CHEST 1 VIEW COMPARISON:  None. FINDINGS: Heart and mediastinal contours are within normal limits. No focal opacities or effusions. No acute bony abnormality. IMPRESSION: No active disease. Electronically Signed   By: Rolm Baptise M.D.   On: 11/04/2019 18:32     Procedures Procedures (including critical care time)  Medications Ordered in ED Medications  ibuprofen (ADVIL) tablet 800 mg (800 mg Oral Given 11/04/19 1906)  ondansetron (ZOFRAN-ODT) disintegrating tablet 4 mg (4 mg Oral Given 11/04/19 1906)  cephALEXin (KEFLEX) capsule 500 mg (500 mg Oral Given 11/04/19 2127)   famotidine (PEPCID) tablet 20 mg (20 mg Oral Given 11/04/19 2149)    ED Course  I have reviewed the triage vital signs and the nursing notes.  Pertinent labs & imaging results that were available during my care of the patient were reviewed by me and considered in my medical decision making (see chart for details).    MDM Rules/Calculators/A&P                      Pt with febrile illness with low back pain with yesterdays visit, now with n/v/d, cough and continued fever. Her rapid covid test ytd was negative, confirmatory test still pending.  Given progressing sx, suspect she may be positive for Covid 19.  Urine equivocal, rbc, wbc's, no nitrites and rare bacteria in a not completely clean catch specimen. However will cover with abx given sx.  Pending culture which was ordered.  She was also prescribed zofran for nausea sx.  Discussed other home tx, ibuprofen/tylenol, fluids and rest.  Discussed home quarantine until covid test results.  Plan f/u with pcp if sx persist/worsen. She was chest pain free at time of dc, cxr clear, trop x 1 negative, pain since last night, no indication for delta trop, d dimer negative, doubt cp sx from PE.    Final Clinical Impression(s) / ED Diagnoses Final diagnoses:  Viral illness    Rx / DC Orders ED Discharge Orders         Ordered    ondansetron (ZOFRAN ODT) 4 MG disintegrating tablet  Every 8 hours PRN     11/04/19 2111    ibuprofen (ADVIL) 800 MG tablet  3 times daily     11/04/19 2111    cephALEXin (KEFLEX) 500 MG capsule  2 times daily     11/04/19 2113           Evalee Jefferson, PA-C 11/05/19 0125    Veryl Speak, MD 11/05/19 [phone]

## 2019-11-04 NOTE — ED Notes (Signed)
Patient came to the desk and states the chair is hard and request a bed. No bed available at present, she is talking on the phone, request to wait outside until a bed is available, informed it would be fine. Upset because she was told it would be fine.

## 2019-11-04 NOTE — Discharge Instructions (Addendum)
Your lab tests and chest xray are reassuring today.  You may have an early urinary infection as you do have some white and red blood cells in your urine today that was not there yesterday.  You are being treated for this with an antibiotic called keflex, take your next dose in the morning.  You have been prescribed zofran which should help with your nausea and vomiting.  Your Covid test is still pending, but given your new vomiting and diarrhea along with your cough,  your symptoms may be from this infection.  I recommend social distancing/home isolation until this has resulted - this should be back by tomorrow. Make sure you are drinking plenty of fluids.

## 2019-11-04 NOTE — Telephone Encounter (Signed)
Call pt Advise her to go to ED for HA and side pain which she sent via mychart. I cannot safely manage this virtually and it sounds like her pain is intense.  I can see mass on left adrenal gland. I have ordered MRI and placed referral to endocrine.  PLEASE CONFIRM NO METAL IN HER BODY.  Please ensure she f/u scheduled with me in 1-2 weeks.  Advise her to call us if she doesn't hear in regards to appts with  MRI/ ENDOCRINE in the next couple of days.

## 2019-11-04 NOTE — ED Triage Notes (Signed)
Pt states she was seen yesterday for back pain. Today has fever and her pcp said to come back.

## 2019-11-04 NOTE — Telephone Encounter (Signed)
Call pt again Please ensure she is going to ed as we didn't discuss HA on Friday nor does this back pain sound like what we disussed then either I want to ensure she is safe and virtual may not be safe with severity of complaints Also confirm NO metal in body prior to MRI

## 2019-11-04 NOTE — Telephone Encounter (Signed)
In ED at [facility]

## 2019-11-04 NOTE — Telephone Encounter (Signed)
Terri Cochran, One more thing as  Reread patients my chart message.   I worry this could also be GERD symptoms. She may trial pepcid ac over the counter or even prilose to see if symptoms improve as they way it is right sided;  Also if pain is worsen with eating that would be something to know as well.  Her gallbladder looked fine on CT ab from ED however she may be having gallstones; again with her constellation of symptoms, I dont feel comfortable with a virtual appointment; she needs in person which is unfortunately ED or an urgent care

## 2019-11-05 ENCOUNTER — Encounter: Payer: Self-pay | Admitting: Family

## 2019-11-05 ENCOUNTER — Telehealth: Payer: Self-pay | Admitting: *Deleted

## 2019-11-05 LAB — GC/CHLAMYDIA PROBE AMP (~~LOC~~) NOT AT ARMC
Chlamydia: NEGATIVE
Neisseria Gonorrhea: NEGATIVE

## 2019-11-05 LAB — NOVEL CORONAVIRUS, NAA (HOSP ORDER, SEND-OUT TO REF LAB; TAT 18-24 HRS): SARS-CoV-2, NAA: DETECTED — AB

## 2019-11-05 NOTE — Telephone Encounter (Signed)
Pt called about results and asked what detected means- Pt advised Pt given Covid-19 positive results. Discussed mild, moderate and severe symptoms. Advised pt to call 911 for any respiratory issues and/dehydration. Discussed non test criteria for ending self isolation. Pt advised of way to manage symptoms at home and review isolation precautions especially the importance of washing hands frequently and wearing a mask when around others. Pt verbalized understanding. Will report to HD.

## 2019-11-05 NOTE — Telephone Encounter (Signed)
Patient called given negative covid results . 

## 2019-11-06 ENCOUNTER — Other Ambulatory Visit: Payer: Self-pay | Admitting: Family

## 2019-11-06 LAB — URINE CULTURE: Culture: 10000 — AB

## 2019-11-09 ENCOUNTER — Encounter: Payer: Self-pay | Admitting: Family

## 2019-11-11 ENCOUNTER — Encounter: Payer: Self-pay | Admitting: Family

## 2019-11-11 ENCOUNTER — Ambulatory Visit (INDEPENDENT_AMBULATORY_CARE_PROVIDER_SITE_OTHER): Payer: 59 | Admitting: Family

## 2019-11-11 ENCOUNTER — Other Ambulatory Visit: Payer: Self-pay | Admitting: Family

## 2019-11-11 VITALS — Ht 66.0 in | Wt 240.0 lb

## 2019-11-11 DIAGNOSIS — K219 Gastro-esophageal reflux disease without esophagitis: Secondary | ICD-10-CM

## 2019-11-11 DIAGNOSIS — U071 COVID-19: Secondary | ICD-10-CM

## 2019-11-11 DIAGNOSIS — N898 Other specified noninflammatory disorders of vagina: Secondary | ICD-10-CM

## 2019-11-11 DIAGNOSIS — E278 Other specified disorders of adrenal gland: Secondary | ICD-10-CM | POA: Diagnosis not present

## 2019-11-11 DIAGNOSIS — E896 Postprocedural adrenocortical (-medullary) hypofunction: Secondary | ICD-10-CM | POA: Insufficient documentation

## 2019-11-11 MED ORDER — FLUCONAZOLE 150 MG PO TABS: 150.0000 mg | ORAL_TABLET | Freq: Once | ORAL | 1 refills | Status: AC

## 2019-11-11 NOTE — Assessment & Plan Note (Signed)
Symptoms consistent with acid reflux.  Advised patient Pepcid AC over-the-counter.  She will let me know if no improvement

## 2019-11-11 NOTE — Assessment & Plan Note (Signed)
Pleased as overall doing well especially regards to respiratory symptoms.  She does still exhibit a fever.  Question whether diarrhea is from Covid.  And GI pathogen panel to be ensure no bacterial component.

## 2019-11-11 NOTE — Assessment & Plan Note (Signed)
Based on symptoms and presentation as well as recent antibiotics, I think is reasonable to start empiric therapy for Candida infection.  Pending urine as well.

## 2019-11-11 NOTE — Assessment & Plan Note (Signed)
Seen on CT ab 10/1999;  Awaiting MR abdomen and referral to endocrine

## 2019-11-11 NOTE — Patient Instructions (Addendum)
Start pepcid AC.  Start diflucan for yeast infection  Ensure to take probiotics while on antibiotics and also for 2 weeks after completion. It is important to re-colonize the gut with good bacteria and also to prevent any diarrheal infections associated with antibiotic use.   Let us know if you do not hear from [place] in regards to MRI of your abdomen, endocrine referral or referral to counseling. Stay safe.

## 2019-11-11 NOTE — Progress Notes (Signed)
Virtual Visit via Video Note  I connected with@  on 11/11/19 at 10:00 AM EST by a video enabled telemedicine application and verified that I am speaking with the correct person using two identifiers.  Location patient: home Location provider:home office Persons participating in the virtual visit: patient, provider  I discussed the limitations of evaluation and management by telemedicine and the availability of in person appointments. The patient expressed understanding and agreed to proceed.   HPI:  COVID positive Felt like yesterday 'broke my low grade fever'. No cough, sob, cp.  Fever 102. Has felt very nausea. Little appetite. Trying to drink Gatorade and water.  Also states bowel movement is 'black'. She has seen brown color on toilet paper , not sure if from anus or vagina.  Doesn't think has a hemorrhoid. Has had diarrhea daily, one today. Watery brown. No abdominal pain.  Concerned for 'yeast infection' with clumpy vaginal discharge. Finishing keflex.  Has heard from counseling. Has taken pregnancy test which were positive and since turned negative. Would like to be pregnant. Appt with OB 11/19/19.   Has complained of epigastric burning, burping. Has taken tums with relief.  Husband is not staying with her, staying at his mother's due to health department.  Hasnt heard from endocrine, no metal in the body  ROS: See pertinent positives and negatives per HPI.  Past Medical History:  Diagnosis Date  . Anxiety   . Depression   . Deviated septum   . GERD (gastroesophageal reflux disease)   . Headache     Past Surgical History:  Procedure Laterality Date  . BREAST REDUCTION SURGERY    . NASAL SEPTUM SURGERY    . TONSILLECTOMY    . WISDOM TOOTH EXTRACTION      Family History  Problem Relation Age of Onset  . Heart attack Father     SOCIAL HX: never smoker   Current Outpatient Medications:  .  cephALEXin (KEFLEX) 500 MG capsule, Take 1 capsule (500 mg total) by  mouth 2 (two) times daily., Disp: 14 capsule, Rfl: 0 .  lidocaine (LIDODERM) 5 %, Place 1 patch onto the skin daily. Remove & Discard patch within 12 hours., Disp: 30 patch, Rfl: 0 .  prenatal vitamin w/FE, FA (PRENATAL 1 + 1) 27-1 MG TABS tablet, Take 1 tablet by mouth daily at 12 noon., Disp: 30 each, Rfl: 12 .  fluconazole (DIFLUCAN) 150 MG tablet, Take 1 tablet (150 mg total) by mouth once for 1 dose. Take one tablet PO once. If sxs persist, may take one tablet PO 3 days later., Disp: 2 tablet, Rfl: 1  EXAM:  VITALS per patient if applicable:  GENERAL: alert, oriented, appears well and in no acute distress  HEENT: atraumatic, conjunttiva clear, no obvious abnormalities on inspection of external nose and ears  NECK: normal movements of the head and neck  LUNGS: on inspection no signs of respiratory distress, breathing rate appears normal, no obvious gross SOB, gasping or wheezing  CV: no obvious cyanosis  MS: moves all visible extremities without noticeable abnormality  PSYCH/NEURO: pleasant and cooperative, no obvious depression or anxiety, speech and thought processing grossly intact  ASSESSMENT AND PLAN:  Discussed the following assessment and plan:  Vaginal discharge - Plan: Urine cytology ancillary only([place]), Urinalysis, Routine w reflex microscopic- SEND OUT, 3 day take home stool cards- Fecal occult blood, imunochemical, Urine Culture, fluconazole (DIFLUCAN) 150 MG tablet, COLLECT [facility]- Gastrointestinal Pathogen Panel PCR, [facility]  Lab test positive for detection of COVID-19  virus  Gastroesophageal reflux disease, unspecified whether esophagitis present  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  Advised patient Pepcid AC over-the-counter.  She will let me know if no improvement        Other   Vaginal discharge - Primary    Based on  symptoms and presentation as well as recent antibiotics, I think is reasonable to start empiric therapy for Candida infection.  Pending urine as well.      Relevant Medications   fluconazole (DIFLUCAN) 150 MG tablet   Other Relevant Orders   Urine cytology ancillary only([place])   Urinalysis, Routine w reflex microscopic- SEND OUT   3 day take home stool cards- Fecal occult blood, imunochemical   Urine Culture   COLLECT [facility]- Gastrointestinal Pathogen Panel PCR, [facility]   Lab test positive for detection of COVID-19 virus    Pleased as overall doing well especially regards to respiratory symptoms.  She does still exhibit a fever.  Question whether diarrhea is from Covid.  And GI pathogen panel to be ensure no bacterial component.      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 Pt prefers to treat via telemedicine empirically rather then risking or undertaking an in person visit at this moment. Patient agrees to seek prompt in person care if worsening, new symptoms arise, or if is not improving with treatment.   I discussed the assessment and treatment plan with the patient. The patient was provided an opportunity to ask questions and all were answered. The patient agreed with the plan and demonstrated an understanding of the instructions.   The patient was advised to call back or seek an in-person evaluation if the symptoms worsen or if the condition fails to improve as anticipated.   Mable Paris, FNP

## 2019-11-13 ENCOUNTER — Telehealth: Payer: Self-pay | Admitting: Family

## 2019-11-13 NOTE — Telephone Encounter (Signed)
Call patient Please let her know that unfortunately since she was Covid positive and our  lab was not sure who brought sample into the office, they had to cancel the samples.  Please apologize again again as I know that this an inconvenience with patient in particular collecting a stool sample.    So this time, we do not have her samples. if she would like to repeat the samples in which I was looking for bacterial infection also blood. I also had ordered urine specimens as well to look for bacteria, blood.   Please advise she has these done at [facility] and coordinate with her how to do this. Orders are in system

## 2019-11-18 ENCOUNTER — Telehealth: Payer: Self-pay | Admitting: Family

## 2019-11-18 NOTE — Telephone Encounter (Signed)
Call pt I see GYN and MR Abdomen scheduled  Has she made an appointment with endocrine? Melissa said they had called her.   Also, per [facility] in regards to appointment for counseling. 'I f the patient residence is in [place] we can not see them due to licensing for our tele health visits. Our providers are only licensed to treat patients who reside in [place] '  Does she know of any counselor in [place], [place]?   I found the following counselor online as I dont know contacts in [place] unfortunately.   [place], [place]  [phone]   [email or web address]  Does she want to call the above and let us know if any problems/concerns? The other option would be , we could try a psychiatrist in town and I can place a referral.

## 2019-11-18 NOTE — Telephone Encounter (Signed)
noted 

## 2019-11-19 ENCOUNTER — Encounter: Payer: 59 | Admitting: Obstetrics and Gynecology

## 2019-11-19 NOTE — Telephone Encounter (Signed)
I have sent patient a long mychart message in regards to this since she a frequent mychart user.

## 2019-11-23 ENCOUNTER — Ambulatory Visit
Admission: RE | Admit: 2019-11-23 | Discharge: 2019-11-23 | Disposition: A | Discharge status: Home / Self Care | Payer: 59 | Source: Ambulatory Visit | Attending: Family | Admitting: Family

## 2019-11-23 ENCOUNTER — Other Ambulatory Visit: Payer: Self-pay

## 2019-11-23 DIAGNOSIS — E278 Other specified disorders of adrenal gland: Secondary | ICD-10-CM | POA: Diagnosis present

## 2019-11-25 ENCOUNTER — Encounter: Payer: Self-pay | Admitting: Family

## 2019-11-25 DIAGNOSIS — K760 Fatty (change of) liver, not elsewhere classified: Secondary | ICD-10-CM | POA: Insufficient documentation

## 2019-11-26 ENCOUNTER — Other Ambulatory Visit: Payer: Self-pay | Admitting: Family

## 2019-11-26 DIAGNOSIS — K760 Fatty (change of) liver, not elsewhere classified: Secondary | ICD-10-CM

## 2019-11-29 ENCOUNTER — Other Ambulatory Visit: Payer: Self-pay

## 2019-11-29 ENCOUNTER — Encounter: Payer: Self-pay | Admitting: Family

## 2019-11-29 ENCOUNTER — Ambulatory Visit (INDEPENDENT_AMBULATORY_CARE_PROVIDER_SITE_OTHER): Payer: 59 | Admitting: Family

## 2019-11-29 VITALS — Ht 66.0 in | Wt 240.0 lb

## 2019-11-29 DIAGNOSIS — K760 Fatty (change of) liver, not elsewhere classified: Secondary | ICD-10-CM

## 2019-11-29 DIAGNOSIS — R195 Other fecal abnormalities: Secondary | ICD-10-CM | POA: Diagnosis not present

## 2019-11-29 DIAGNOSIS — F4323 Adjustment disorder with mixed anxiety and depressed mood: Secondary | ICD-10-CM

## 2019-11-29 DIAGNOSIS — E278 Other specified disorders of adrenal gland: Secondary | ICD-10-CM | POA: Diagnosis not present

## 2019-11-29 NOTE — Progress Notes (Signed)
Virtual Visit via Video Note  I connected with@  on 11/29/19 at 11:30 AM EST by a video enabled telemedicine application and verified that I am speaking with the correct person using two identifiers.  Location patient: home Location provider:work  Persons participating in the virtual visit: patient, provider  I discussed the limitations of evaluation and management by telemedicine and the availability of in person appointments. The patient expressed understanding and agreed to proceed.   HPI: Follow up  "feels much better.'   Had BRB seen on toilet paper 3 days ago, thinks 'scraped something when pooping.' Didn't look at stool. No blood in stool. Color has been 'yellowish' for past couple of weeks. No diet changes.  This happens once and a while. H/o constipation and last year had been on miralax. No constipation today.    Renal mass-has appointment with endocrine.  Plans to find local counseling in [place]. Feels 'positive'. No si/hi.  Presented to emergency room for febrile illness 11/04/2019.  Liver labs, bilirubin WNL 11/03/19.   ROS: See pertinent positives and negatives per HPI.  Past Medical History:  Diagnosis Date  . Anxiety   . Depression   . Deviated septum   . GERD (gastroesophageal reflux disease)   . Headache     Past Surgical History:  Procedure Laterality Date  . BREAST REDUCTION SURGERY    . NASAL SEPTUM SURGERY    . TONSILLECTOMY    . WISDOM TOOTH EXTRACTION      Family History  Problem Relation Age of Onset  . Heart attack Father     SOCIAL HX: never smoker   Current Outpatient Medications:  .  prenatal vitamin w/FE, FA (PRENATAL 1 + 1) 27-1 MG TABS tablet, Take 1 tablet by mouth daily at 12 noon., Disp: 30 each, Rfl: 12  EXAM:  VITALS per patient if applicable:  GENERAL: alert, oriented, appears well and in no acute distress  HEENT: atraumatic, conjunttiva clear, no obvious abnormalities on inspection of external nose and ears  NECK:  normal movements of the head and neck  LUNGS: on inspection no signs of respiratory distress, breathing rate appears normal, no obvious gross SOB, gasping or wheezing  CV: no obvious cyanosis  MS: moves all visible extremities without noticeable abnormality  PSYCH/NEURO: pleasant and cooperative, no obvious depression or anxiety, speech and thought processing grossly intact  ASSESSMENT AND PLAN:  Discussed the following assessment and plan:  Fatty liver disease, nonalcoholic - Plan: Referral to Nutrition and Diabetes Services  Change in stool  Left adrenal mass (HCC)  Adjustment reaction with anxiety and depression Problem List Items Addressed This Visit      Digestive   Fatty liver disease, nonalcoholic - Primary    Discussed weight loss as paramount. Referral to nutrition.  Low-carb diet given to patient.  Waiting on GI appointment .will follow      Relevant Orders   Referral to Nutrition and Diabetes Services     Other   Adjustment reaction with anxiety and depression    In video today, patient appeared very very upbeat which is reassuring to see.  She is currently looking for counseling in [place].  She will let me know if any issues in doing so.  Will follow      Change in stool    Nonspecific change in stool color to a "greenish".  Reviewed recent labs and bilirubin, liver enzymes, alkaline phosphatase normal.  Not sure the significance of this.  Advised patient emphatically that she needs  to bring this up when she speaks with GI as currently have a referral in place due to fatty liver disease and episode of rectal bleeding ( resolved). Will follow      Left adrenal mass [facility])    Appointment with endocrine upcoming.          -we discussed possible serious and likely etiologies, options for evaluation and workup, limitations of telemedicine visit vs in person visit, treatment, treatment risks and precautions. Pt prefers to treat via telemedicine empirically rather  then risking or undertaking an in person visit at this moment. Patient agrees to seek prompt in person care if worsening, new symptoms arise, or if is not improving with treatment.   I discussed the assessment and treatment plan with the patient. The patient was provided an opportunity to ask questions and all were answered. The patient agreed with the plan and demonstrated an understanding of the instructions.   The patient was advised to call back or seek an in-person evaluation if the symptoms worsen or if the condition fails to improve as anticipated.   Mable Paris, FNP

## 2019-11-29 NOTE — Assessment & Plan Note (Signed)
Appointment with endocrine upcoming.

## 2019-11-29 NOTE — Assessment & Plan Note (Signed)
Nonspecific change in stool color to a "greenish".  Reviewed recent labs and bilirubin, liver enzymes, alkaline phosphatase normal.  Not sure the significance of this.  Advised patient emphatically that she needs to bring this up when she speaks with GI as currently have a referral in place due to fatty liver disease and episode of rectal bleeding ( resolved). Will follow

## 2019-11-29 NOTE — Patient Instructions (Signed)
Let me know if you need anything.   This is  Dr. Lupita Dawn  example of a  "Low GI"  Diet:  It will allow you to lose 4 to 8  lbs  per month if you follow it carefully.  Your goal with exercise is a minimum of 30 minutes of aerobic exercise 5 days per week (Walking does not count once it becomes easy!)    All of the foods can be found at grocery stores and in bulk at [employer].  The Atkins protein bars and shakes are available in more varieties at Target, [employer]Mart and [employer].     7 AM Breakfast:  Choose from the following:  Low carbohydrate Protein  Shakes (I recommend the  Premier Protein chocolate shakes,  EAS AdvantEdge "Carb Control" shakes  Or the Atkins shakes all are under 3 net carbs)     a scrambled egg/bacon/cheese burrito made with Mission's "carb balance" whole wheat tortilla  (about 10 net carbs )  Regulatory affairs officer (basically a quiche without the pastry crust) that is eaten cold and very convenient way to get your eggs.  8 carbs)  If you make your own protein shakes, avoid bananas and pineapple,  And use low carb greek yogurt or original /unsweetened almond or soy milk    Avoid cereal and bananas, oatmeal and cream of wheat and grits. They are loaded with carbohydrates!   10 AM: high protein snack:  Protein bar by Atkins (the snack size, under 200 cal, usually < 6 net carbs).    A stick of cheese:  Around 1 carb,  100 cal     Dannon Light n Fit [place] Yogurt  (80 cal, 8 carbs)  Other so called "protein bars" and Greek yogurts tend to be loaded with carbohydrates.  Remember, in food advertising, the word "energy" is synonymous for " carbohydrate."  Lunch:   A Sandwich using the bread choices listed, Can use any  Eggs,  lunchmeat, grilled meat or canned tuna), avocado, regular mayo/mustard  and cheese.  A Salad using blue cheese, ranch,  Goddess or vinagrette,  Avoid taco shells, croutons or "confetti" and no "candied nuts" but regular nuts OK.   No  pretzels, nabs  or chips.  Pickles and miniature sweet peppers are a good low carb alternative that provide a "crunch"  The bread is the only source of carbohydrate in a sandwich and  can be decreased by trying some of the attached alternatives to traditional loaf bread   Avoid "Low fat dressings, as well as [place] dressings They are loaded with sugar!   3 PM/ Mid day  Snack:  Consider  1 ounce of  almonds, walnuts, pistachios, pecans, peanuts,  Macadamia nuts or a nut medley.  Avoid "granola and granola bars "  Mixed nuts are ok in moderation as long as there are no raisins,  cranberries or dried fruit.   KIND bars are OK if you get the low glycemic index variety   Try the prosciutto/mozzarella cheese sticks by Fiorruci  In deli /backery section   High protein      6 PM  Dinner:     Meat/fowl/fish with a green salad, and either broccoli, cauliflower, green beans, spinach, brussel sprouts or  Lima beans. DO NOT BREAD THE PROTEIN!!      There is a low carb pasta by Dreamfield's that is acceptable and tastes great: only 5 digestible carbs/serving.( All grocery stores but  BJs carry it ) Several ready made meals are available low carb:   Try Michel Angelo's chicken piccata or chicken or eggplant parm over low carb pasta.(Lowes and BJs)   Marjory Lies Sanchez's "Carnitas" (pulled pork, no sauce,  0 carbs) or his beef pot roast to make a dinner burrito (at BJ's)  Pesto over low carb pasta (bj's sells a good quality pesto in the center refrigerated section of the deli   Try satueeing  Cheral Marker with mushroooms as a good side   [employer] Giant makes a mashed cauliflower that tastes like mashed potatoes  Whole wheat pasta is still full of digestible carbs and  Not as low in glycemic index as Dreamfield's.   Brown rice is still rice,  So skip the rice and noodles if you eat [place] or [place] (or at least limit to 1/2 cup)  9 PM snack :   Breyer's "low carb" fudgsicle or  ice cream bar (Carb  Smart line), or  Weight Watcher's ice cream bar , or another "no sugar added" ice cream;  a serving of fresh berries/cherries with whipped cream   Cheese or DANNON'S LlGHT N FIT GREEK YOGURT  8 ounces of [place] Diamond unsweetened almond/cococunut milk    Treat yourself to a parfait made with whipped cream blueberiies, walnuts and vanilla greek yogurt  Avoid bananas, pineapple, grapes  and watermelon on a regular basis because they are high in sugar.  THINK OF THEM AS DESSERT  Remember that snack Substitutions should be less than 10 NET carbs per serving and meals < 20 carbs. Remember to subtract fiber grams to get the "net carbs."  @TULLOBREADPACKAGE @

## 2019-11-29 NOTE — Assessment & Plan Note (Addendum)
Discussed weight loss as paramount. Referral to nutrition.  Low-carb diet given to patient.  Waiting on GI appointment .will follow

## 2019-11-29 NOTE — Assessment & Plan Note (Signed)
In video today, patient appeared very very upbeat which is reassuring to see.  She is currently looking for counseling in [place].  She will let me know if any issues in doing so.  Will follow

## 2019-12-03 ENCOUNTER — Other Ambulatory Visit: Payer: Self-pay

## 2019-12-03 ENCOUNTER — Ambulatory Visit (INDEPENDENT_AMBULATORY_CARE_PROVIDER_SITE_OTHER): Payer: 59 | Admitting: Obstetrics and Gynecology

## 2019-12-03 ENCOUNTER — Encounter: Payer: Self-pay | Admitting: Obstetrics and Gynecology

## 2019-12-03 VITALS — BP 127/88 | HR 91 | Ht 66.0 in | Wt 239.1 lb

## 2019-12-03 DIAGNOSIS — N914 Secondary oligomenorrhea: Secondary | ICD-10-CM | POA: Diagnosis not present

## 2019-12-03 NOTE — Progress Notes (Signed)
HPI:      [name] is a 29 y.o. G1P0 who LMP was Patient's last menstrual period was 11/17/2019 (approximate).  Subjective:   She presents today with complaint of irregular menstrual cycle since a miscarriage that she had in May.  She has been attempting pregnancy and has done a number of pregnancy test which have proved negative. Patient had irregular cycles for some of her life and then was placed on OCPs for several months which then seemed to spontaneously restart her monthly menses. She became pregnant in May and had a first trimester miscarriage spontaneously. Her beta hCG remained positive at a low level until it was found to be negative in September. She had a menstrual period in November and a late menses in December.  Patient also complains of urinary symptoms including urgency and frequency.    Hx: The following portions of the patient's history were reviewed and updated as appropriate:             She  has a past medical history of Anxiety, Depression, Deviated septum, GERD (gastroesophageal reflux disease), and Headache. She does not have any pertinent problems on file. She  has a past surgical history that includes Nasal septum surgery; Breast reduction surgery; Wisdom tooth extraction; and Tonsillectomy. Her family history includes Heart attack in her father. She  reports that she has never smoked. She has never used smokeless tobacco. She reports previous alcohol use. She reports previous drug use. She has a current medication list which includes the following prescription(s): prenatal vitamin w/fe, fa. She has No Known Allergies.       Review of Systems:  Review of Systems  Constitutional: Denied constitutional symptoms, night sweats, recent illness, fatigue, fever, insomnia and weight loss.  Eyes: Denied eye symptoms, eye pain, photophobia, vision change and visual disturbance.  Ears/Nose/Throat/Neck: Denied ear, nose, throat or neck symptoms, hearing loss,  nasal discharge, sinus congestion and sore throat.  Cardiovascular: Denied cardiovascular symptoms, arrhythmia, chest pain/pressure, edema, exercise intolerance, orthopnea and palpitations.  Respiratory: Denied pulmonary symptoms, asthma, pleuritic pain, productive sputum, cough, dyspnea and wheezing.  Gastrointestinal: Denied, gastro-esophageal reflux, melena, nausea and vomiting.  Genitourinary: See HPI for additional information.  Musculoskeletal: Denied musculoskeletal symptoms, stiffness, swelling, muscle weakness and myalgia.  Dermatologic: Denied dermatology symptoms, rash and scar.  Neurologic: Denied neurology symptoms, dizziness, headache, neck pain and syncope.  Psychiatric: Denied psychiatric symptoms, anxiety and depression.  Endocrine: Denied endocrine symptoms including hot flashes and night sweats.   Meds:   Current Outpatient Medications on File Prior to Visit  Medication Sig Dispense Refill  . prenatal vitamin w/FE, FA (PRENATAL 1 + 1) 27-1 MG TABS tablet Take 1 tablet by mouth daily at 12 noon. 30 each 12   No current facility-administered medications on file prior to visit.    Objective:     Vitals:   12/03/19 1015  BP: 127/88  Pulse: 91                Assessment:    G1P0 Patient Active Problem List   Diagnosis Date Noted  . Change in stool 11/29/2019  . Fatty liver disease, nonalcoholic [id number]  . Vaginal discharge 11/11/2019  . Lab test positive for detection of COVID-19 virus 11/11/2019  . GERD (gastroesophageal reflux disease) 11/11/2019  . Left adrenal mass ([facility]) 11/11/2019  . Adjustment reaction with anxiety and depression 11/01/2019  . Low back pain 11/01/2019  . Encounter for preconception consultation 11/01/2019     1.  Secondary oligomenorrhea     Possible PCO based on history and oligomenorrhea.   Some symptoms possibly consistent with UTI.  Plan:            1.  PCO labs  2.  Urine for C&S Orders Orders Placed This Encounter   Procedures  . Urine culture  . DHEA-sulfate  . FSH/LH  . Glucose, fasting  . Insulin, random  . Prolactin  . Testosterone, Free, Total, SHBG  . TSH    No orders of the defined types were placed in this encounter.     F/U  Return for We will contact her with any abnormal test results. I spent 33 minutes involved in the care of this patient preparing to see the patient by obtaining and reviewing her medical history (including labs, imaging tests and prior procedures), documenting clinical information in the electronic health record (EHR), writing and sending prescriptions, ordering tests or procedures and directly communicating with the patient discussing pertinent items from her history and physical exam as well as my assessment and plan as noted above.  All of her questions were answered.  Finis Bud, M.D. 12/03/2019 10:53 AM

## 2019-12-04 ENCOUNTER — Encounter: Payer: Self-pay | Admitting: Family

## 2019-12-04 LAB — PROLACTIN: Prolactin: 23.1 ng/mL (ref 4.8–23.3)

## 2019-12-04 LAB — TSH: TSH: 1.2 u[IU]/mL (ref 0.450–4.500)

## 2019-12-04 LAB — TESTOSTERONE, FREE, TOTAL, SHBG
Sex Hormone Binding: 18.6 nmol/L — ABNORMAL LOW (ref 24.6–122.0)
Testosterone, Free: 3.7 pg/mL (ref 0.0–4.2)
Testosterone: 73 ng/dL — ABNORMAL HIGH (ref 8–48)

## 2019-12-04 LAB — DHEA-SULFATE: DHEA-SO4: 280 ug/dL (ref 84.8–378.0)

## 2019-12-04 LAB — INSULIN, RANDOM: INSULIN: 21.2 u[IU]/mL (ref 2.6–24.9)

## 2019-12-04 LAB — FSH/LH
FSH: 4 m[IU]/mL
LH: 16.3 m[IU]/mL

## 2019-12-04 LAB — GLUCOSE, FASTING: Glucose, Plasma: 94 mg/dL (ref 65–99)

## 2019-12-05 LAB — URINE CULTURE

## 2019-12-06 ENCOUNTER — Other Ambulatory Visit: Payer: Self-pay

## 2019-12-06 ENCOUNTER — Other Ambulatory Visit: Payer: Self-pay | Admitting: Family

## 2019-12-06 DIAGNOSIS — K760 Fatty (change of) liver, not elsewhere classified: Secondary | ICD-10-CM

## 2019-12-09 NOTE — Progress Notes (Signed)
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 is a 29 y.o. female with unremarkable past medical history . The patient presented for initial endocrinology clinic visit on 12/10/2019 for consultative assistance with her left adrenal myelolipoma..   Pt was noted to have a left adrenal myelolipoma on CT imagine during work up for right sided abdominal pain.    Adrenal incidentaloma: Substantial weight gain - No  Centripetal obesity- No  Easy bruisability- no Severe hypertension- no DM no  Proximal muscle weakness- feels weakness al over body  Fatigue- all the time  Sudden/ severe headaches - yes Weight loss?- yes Anxiety attacks- not as frequently Sweating- yes  Cardiac arrhythmias- no  Palpitations- during anxiety attacks Fluid retention - yes  Hypokalemia- no   Has irregular periods - currently seeing  Gyn   Works as a Transport planner  HISTORY:  Past Medical History:  Past Medical History:  Diagnosis Date  . Anxiety   . Depression   . Deviated septum   . GERD (gastroesophageal reflux disease)   . Headache    Past Surgical History:  Past Surgical History:  Procedure Laterality Date  . BREAST REDUCTION SURGERY    . NASAL SEPTUM SURGERY    . TONSILLECTOMY    . WISDOM TOOTH EXTRACTION        Social History:  reports that she has never smoked. She has never used smokeless tobacco. She reports previous alcohol use. She reports previous drug use.  Family History: family history includes Heart attack in her father.   HOME MEDICATIONS: Allergies as of 12/10/2019   No Known Allergies     Medication List       Accurate as of December 10, 2019 12:35 PM. If you have any questions, ask your nurse or doctor.        prenatal vitamin w/FE, FA 27-1  MG Tabs tablet Take 1 tablet by mouth daily at 12 noon.         REVIEW OF SYSTEMS: A comprehensive ROS was conducted with the patient and is negative except as per HPI      OBJECTIVE:  VS: BP 124/86 (BP Location: Right Arm, Patient Position: Sitting, Cuff Size: Large)   Pulse 84   Temp 98.3 F (36.8 C)   Ht 5\' 6"  (1.676 m)   Wt 235 lb 12.8 oz (107 kg)   LMP 11/17/2019 (Approximate)   SpO2 95%   BMI 38.06 kg/m    Wt Readings from Last 3 Encounters:  12/10/19 235 lb 12.8 oz (107 kg)  12/03/19 239 lb 1.6 oz (108.5 kg)  11/29/19 240 lb (108.9 kg)     EXAM: General: Pt appears well and is in NAD  Eyes: External eye exam normal without stare, lid lag or exophthalmos.  EOM intact.  .  Neck: General: Supple without adenopathy. Thyroid: Thyroid size normal.  No goiter or nodules appreciated. No thyroid bruit.  Lungs: Clear with good BS bilat with no rales, rhonchi, or wheezes  Heart: Auscultation: RRR.  Abdomen: Normoactive bowel sounds, soft, nontender, without masses or organomegaly palpable  Extremities:  BL LE: No pretibial edema normal ROM and strength.  Skin: Hair: Texture and amount normal with gender appropriate distribution Skin Inspection: No rashes Skin  Palpation: Skin temperature, texture, and thickness normal to palpation  Neuro: Cranial nerves: II - XII grossly intact  Motor: Normal strength throughout DTRs: 2+ and symmetric in UE without delay in relaxation phase  Mental Status: Judgment, insight: Intact Orientation: Oriented to time, place, and person Mood and affect: No depression, anxiety, or agitation     DATA REVIEWED: Results for Terri Cochran (MRN [id number]) as of 12/10/2019 12:35  Ref. Range 12/10/2019 09:20  Potassium Latest Ref Range: 3.5 - 5.1 mEq/L 3.8      MRI Abdomen 11/23/2019  Adrenals/Urinary Tract: Corresponding to the CT abnormality is a the large left adrenal mass which is predominantly fat containing a few foci of soft  tissue attenuation as well as calcification. This measures 10.3 x 7.8 cm, image 19/36. Previously this was measured at 12.8 x 7.2 cm. As noted previously there is mass effect upon the surrounding retroperitoneal structures with inferior displacement of the left kidney.  Normal appearance of the right adrenal gland. No kidney mass or hydronephrosis identified bilaterally.   IMPRESSION: 1. 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 2. Hepatic steatosis.  ASSESSMENT/PLAN/RECOMMENDATIONS:   1. Left adrenal Myelolipoma:   - Eighty five percent of adrenal adenomas are nonsecretory. This risk if even less in myelolipomas.  - We discussed that myelolipomas are benign but due to large size could cause mass effect, which is the case here.   - Three forms of adrenal hyperfunction should be considered in patients with adrenal incidentaloma   Glucocorticoid hypersecretion  Primary hyperaldosteronism  Pheochromocytoma  - Will proceed with K, Aldo: renin ratio, 24 hr urine collection for cortisol, catecholamines and metanephrines.  - Pt understands  She will eventually need surgical resection due to size of the myelolipoma     F/U in 6 months  Signed electronically by: Mack Guise, MD  [facility] Endocrinology  [facility] [address] [place], [place] [address] Phone: [phone] FAX: [phone]   CC: Burnard Hawthorne, FNP [address] [place] [address] Phone: [phone] Fax: [phone]   Return to Endocrinology clinic as below: Future Appointments  Date Time Provider [place]  06/08/2020  9:10 AM Laretta Pyatt, Melanie Crazier, MD [facility] None

## 2019-12-10 ENCOUNTER — Other Ambulatory Visit: Payer: Self-pay

## 2019-12-10 ENCOUNTER — Ambulatory Visit (INDEPENDENT_AMBULATORY_CARE_PROVIDER_SITE_OTHER): Payer: 59 | Admitting: Internal Medicine

## 2019-12-10 ENCOUNTER — Encounter: Payer: Self-pay | Admitting: Internal Medicine

## 2019-12-10 ENCOUNTER — Other Ambulatory Visit (INDEPENDENT_AMBULATORY_CARE_PROVIDER_SITE_OTHER): Payer: 59

## 2019-12-10 VITALS — BP 124/86 | HR 84 | Temp 98.3°F | Ht 66.0 in | Wt 235.8 lb

## 2019-12-10 DIAGNOSIS — D1779 Benign lipomatous neoplasm of other sites: Secondary | ICD-10-CM | POA: Diagnosis not present

## 2019-12-10 LAB — POTASSIUM: Potassium: 3.8 mEq/L (ref 3.5–5.1)

## 2019-12-10 NOTE — Patient Instructions (Signed)

## 2019-12-11 ENCOUNTER — Encounter: Payer: Self-pay | Admitting: Gastroenterology

## 2019-12-13 ENCOUNTER — Telehealth: Payer: Self-pay | Admitting: Obstetrics and Gynecology

## 2019-12-13 NOTE — Telephone Encounter (Signed)
LM for patient to return call.

## 2019-12-13 NOTE — Telephone Encounter (Signed)
Pt called in requesting a call back about her results. please advise

## 2019-12-16 NOTE — Telephone Encounter (Signed)
Patient sent a my chart message about results. I responded to the message.

## 2019-12-17 LAB — CATECHOLAMINES, FRACTIONATED, URINE, 24 HOUR
Calc Total (E+NE): 72 mcg/24 h (ref 26–121)
Creatinine, Urine mg/day-CATEUR: 2.19 g/(24.h) — ABNORMAL HIGH (ref 0.50–2.15)
Dopamine 24 Hr Urine: 532 mcg/24 h — ABNORMAL HIGH (ref 52–480)
Epinephrine, 24H, Ur: 11 mcg/24 h (ref 2–24)
Norepinephrine, 24H, Ur: 61 mcg/24 h (ref 15–100)
Total Volume: 1250 mL

## 2019-12-17 LAB — ALDOSTERONE + RENIN ACTIVITY W/O RATIO: ALDOSTERONE: 11.3 ng/dL (ref 0.0–30.0)

## 2019-12-17 LAB — CORTISOL, URINE, 24 HOUR
24 Hour urine volume (VMAHVA): 1250 mL
Cortisol (Ur), Free: 21.3 mcg/24 h (ref 4.0–50.0)
RESULTS RECEIVED: 2.15 g/(24.h) (ref 0.50–2.15)

## 2019-12-17 LAB — METANEPHRINES, URINE, 24 HOUR
Metaneph Total, Ur: 787 mcg/24 h — ABNORMAL HIGH (ref 115–695)
Metanephrines, Ur: 184 mcg/24 h (ref 36–190)
Normetanephrine, 24H Ur: 603 mcg/24 h — ABNORMAL HIGH (ref 35–482)
Volume, Urine-VMAUR: 1250 mL

## 2019-12-19 ENCOUNTER — Telehealth: Payer: Self-pay | Admitting: Internal Medicine

## 2019-12-19 ENCOUNTER — Encounter: Payer: Self-pay | Admitting: Internal Medicine

## 2019-12-19 DIAGNOSIS — D1779 Benign lipomatous neoplasm of other sites: Secondary | ICD-10-CM

## 2019-12-19 NOTE — Telephone Encounter (Signed)
    Results for Terri Cochran, Terri Cochran (MRN [id number]) as of 12/19/2019 11:04  Ref. Range 12/12/2019 09:20  Dopamine 24 Hr Urine Latest Ref Range: 52 - 480 mcg/24 h 532 (H)  Epinephrine, 24H, Ur Latest Ref Range: 2 - 24 mcg/24 h 11  Metanephrine, Ur Latest Ref Range: 115 - 695 mcg/24 h 787 (H)  Metanephrines, Ur Latest Ref Range: 36 - 190 mcg/24 h 184  Norepinephrine, 24H, Ur Latest Ref Range: 15 - 100 mcg/24 h 61  Total Volume Latest Units: mL 1,250  Creatinine, Urine mg/day-CATEUR Latest Ref Range: 0.50 - 2.15 g/24 h 2.19 (H)  Normetanephr.,U,24h Latest Ref Range: 35 - 482 mcg/24 h 603 (H)  Volume, Urine-VMAUR Latest Units: mL 1,250   Results for Terri Cochran, Terri Cochran (MRN [id number]) as of 12/19/2019 11:04  Ref. Range 12/03/2019 10:58  DHEA-SO4 Latest Ref Range: 84.8 - 378.0 ug/dL 280.0   Results for Terri Cochran, Terri Cochran (MRN [id number]) as of 12/19/2019 11:04  Ref. Range 12/10/2019 09:20  ALDOSTERONE Latest Ref Range: 0.0 - 30.0 ng/dL 11.3   No evidence of aldosterone, or cortisol hypersecretion Minimal elevation of normetanephrine not consistent with pheochromocytoma  Will proceed with surgical resection   A referral to Dr.  Fredirick Maudlin was entered   Pt expressed understanding   Abby Nena Jordan, MD  [facility] Endocrinology  [facility] [address] [place], [place] [address] Phone: [phone] FAX: [phone]

## 2019-12-24 ENCOUNTER — Telehealth: Payer: Self-pay | Admitting: General Surgery

## 2019-12-24 NOTE — Telephone Encounter (Signed)
Patient is calling and is asking for one of the nurses to give her a call, she would like to know a little bit more about surgery. Please call patient and advise.

## 2019-12-24 NOTE — Telephone Encounter (Signed)
Spoke with patient-she wanted to know how long she could expect o be out of work if she has surgery. I let her know that Dr.Cannon would address at her appointment 12/31/19.

## 2019-12-24 NOTE — Telephone Encounter (Signed)
Attempted to call patient, no answer, no voice mail

## 2019-12-25 ENCOUNTER — Other Ambulatory Visit: Payer: Self-pay | Admitting: Family

## 2019-12-25 ENCOUNTER — Ambulatory Visit (INDEPENDENT_AMBULATORY_CARE_PROVIDER_SITE_OTHER): Payer: 59 | Admitting: Obstetrics and Gynecology

## 2019-12-25 ENCOUNTER — Encounter: Payer: Self-pay | Admitting: Family

## 2019-12-25 ENCOUNTER — Encounter: Payer: Self-pay | Admitting: Obstetrics and Gynecology

## 2019-12-25 ENCOUNTER — Other Ambulatory Visit: Payer: Self-pay

## 2019-12-25 ENCOUNTER — Emergency Department: Admission: EM | Admit: 2019-12-25 | Discharge: 2019-12-25 | Discharge status: Against Medical Advice | Payer: 59

## 2019-12-25 VITALS — BP 125/87 | HR 74 | Ht 66.0 in | Wt 241.0 lb

## 2019-12-25 DIAGNOSIS — Z3169 Encounter for other general counseling and advice on procreation: Secondary | ICD-10-CM

## 2019-12-25 DIAGNOSIS — Z5321 Procedure and treatment not carried out due to patient leaving prior to being seen by health care provider: Secondary | ICD-10-CM

## 2019-12-25 NOTE — Progress Notes (Signed)
Patient comes in today for lab results.

## 2019-12-31 ENCOUNTER — Encounter: Payer: Self-pay | Admitting: General Surgery

## 2019-12-31 ENCOUNTER — Ambulatory Visit (INDEPENDENT_AMBULATORY_CARE_PROVIDER_SITE_OTHER): Payer: 59 | Admitting: General Surgery

## 2019-12-31 ENCOUNTER — Other Ambulatory Visit: Payer: Self-pay

## 2019-12-31 VITALS — BP 127/88 | HR 92 | Temp 97.7°F | Resp 12 | Ht 66.0 in | Wt 241.2 lb

## 2019-12-31 DIAGNOSIS — E278 Other specified disorders of adrenal gland: Secondary | ICD-10-CM | POA: Diagnosis not present

## 2019-12-31 NOTE — Patient Instructions (Addendum)
Our surgery scheduler Marzetta Board will contact you within the next 24-48 hours. When Stacy contacts you she will discuss the surgery dates, times and she will also discuss the preparation for surgery. Please have the BLUE sheet available when she contacts you. If you have any questions or concerns, please feel free to contact our office.   Dr.Cannon discussed with patient that the recovery phase would be six weeks with no heavy lifting of no more 10 lbs or more.

## 2019-12-31 NOTE — Progress Notes (Signed)
Patient ID: Terri Cochran, female   DOB: January 16, 1991, 29 y.o.   MRN: [id number]  Chief Complaint  Patient presents with  . New Patient (Initial Visit)     new pt ref Dr.Shamleffer Myelolipoma left adrenal gland    HPI Terri Cochran is a 29 y.o. female.   She has been referred to me by her endocrinologist, Dr. Kelton Pillar, for surgical management of a large left adrenal myelolipoma.  Terri Cochran initially underwent a CT scan of the abdomen and pelvis to evaluate right lower quadrant pain that she had been experiencing.  The imaging was negative for appendicitis but did demonstrate a large mass involving the left adrenal gland.  Imaging characteristics were most consistent with a myelolipoma.  Subsequent MRI also confirmed this as the likely diagnosis.  She was referred to endocrinology where she underwent thorough biochemical testing.  This was negative for hypersecretion.  The mass is quite large, around 12 cm, and is displacing the kidney and pancreas.  Due to the mass effect presented by the lesion, she has been referred for surgical excision.  Terri Cochran has a number of complaints today, none of which seem related to this mass, predominantly right flank and back pain.  She is being evaluated for fertility issues and possible polycystic ovary disease.  She does not have any heart palpitations.  No syncope or near syncope.  No concern for difficult to control hypertension or issues with poorly controlled blood sugar.  No proximal muscle weakness or centripetal weight gain.  No significant issues with hypokalemia or hypernatremia.  No complaints of masculinization/hirsutism.   Past Medical History:  Diagnosis Date  . Anxiety   . Depression   . Deviated septum   . GERD (gastroesophageal reflux disease)   . Headache     Past Surgical History:  Procedure Laterality Date  . BREAST REDUCTION SURGERY    . NASAL SEPTUM SURGERY    . TONSILLECTOMY    . WISDOM TOOTH EXTRACTION      Family  History  Problem Relation Age of Onset  . Heart attack Father     Social History Social History   Tobacco Use  . Smoking status: Never Smoker  . Smokeless tobacco: Never Used  Substance Use Topics  . Alcohol use: Not Currently  . Drug use: Not Currently    No Known Allergies  Current Outpatient Medications  Medication Sig Dispense Refill  . prenatal vitamin w/FE, FA (PRENATAL 1 + 1) 27-1 MG TABS tablet Take 1 tablet by mouth daily at 12 noon. 30 each 12   No current facility-administered medications for this visit.    Review of Systems Review of Systems  Constitutional: Positive for fatigue.  Musculoskeletal: Positive for back pain.  All other systems reviewed and are negative.   Blood pressure 127/88, pulse 92, temperature 97.7 F (36.5 C), temperature source Temporal, resp. rate 12, height 5\' 6"  (1.676 m), weight 241 lb 3.2 oz (109.4 kg), SpO2 98 %, unknown if currently breastfeeding. Body mass index is 38.93 kg/m.  Physical Exam Physical Exam Constitutional:      General: She is not in acute distress.    Appearance: Normal appearance. She is obese.  HENT:     Head: Normocephalic and atraumatic.     Comments: No facial plethora or "moon facies."  Normal female distribution of hair.    Nose:     Comments: Covered with a mask secondary to COVID-19 precautions    Mouth/Throat:     Comments:  Covered with a mask secondary to COVID-19 precautions Eyes:     General: No scleral icterus.       Right eye: No discharge.        Left eye: No discharge.     Conjunctiva/sclera: Conjunctivae normal.  Neck:     Comments: No enlargement in the dorsocervical fat pad. Cardiovascular:     Rate and Rhythm: Normal rate and regular rhythm.     Pulses: Normal pulses.  Pulmonary:     Effort: Pulmonary effort is normal.     Breath sounds: Normal breath sounds.  Abdominal:     General: Bowel sounds are normal. There is no distension.     Palpations: Abdomen is soft. There is no  mass.     Comments: No violaceous striae.  Genitourinary:    Comments: Deferred Musculoskeletal:        General: No swelling.     Cervical back: No rigidity.     Right lower leg: No edema.     Left lower leg: No edema.  Lymphadenopathy:     Cervical: No cervical adenopathy.  Skin:    General: Skin is warm and dry.  Neurological:     General: No focal deficit present.     Mental Status: She is alert and oriented to person, place, and time.  Psychiatric:        Mood and Affect: Mood normal.     Data Reviewed I reviewed all of the laboratory studies that have been performed.  Although there are mild elevations in some of the catecholamines/metanephrines, the do not reach the criteria necessary to establish a diagnosis of pheochromocytoma.  Cortisol studies are also normal.  Aldosterone is within normal range; renin value not available. Results for Terri, Cochran (MRN [id number]) as of 12/31/2019 12:20  Ref. Range 12/03/2019 10:58 12/10/2019 09:20  ALDOSTERONE Latest Ref Range: 0.0 - 30.0 ng/dL  11.3  DHEA-SO4 Latest Ref Range: 84.8 - 378.0 ug/dL 280.0   Renin Latest Units: ng/mL/hr  CANCELED  LH Latest Units: mIU/mL 16.3   FSH Latest Units: mIU/mL 4.0   Prolactin Latest Ref Range: 4.8 - 23.3 ng/mL 23.1   Glucose, Plasma Latest Ref Range: 65 - 99 mg/dL 94   Sex Horm Binding Glob, Serum Latest Ref Range: 24.6 - 122.0 nmol/L 18.6 (L)   Testosterone Latest Ref Range: 8 - 48 ng/dL 73 (H)   Testosterone Free Latest Ref Range: 0.0 - 4.2 pg/mL 3.7   INSULIN Latest Ref Range: 2.6 - 24.9 uIU/mL 21.2   TSH Latest Ref Range: 0.450 - 4.500 uIU/mL 1.200   Results for Terri, Cochran (MRN [id number]) as of 12/31/2019 12:20  Ref. Range 12/12/2019 09:20  24 Hour urine volume (VMAHVA) Latest Units: mL 1,250  Calc Total (E+NE) Latest Ref Range: 26 - 121 mcg/24 h 72  Cortisol (Ur), Free Latest Ref Range: 4.0 - 50.0 mcg/24 h 21.3  Results received Latest Ref Range: 0.50 - 2.15 g/24 h 2.15   Results  for Terri, Cochran (MRN [id number]) as of 12/31/2019 12:20  Ref. Range 12/12/2019 09:20  Dopamine 24 Hr Urine Latest Ref Range: 52 - 480 mcg/24 h 532 (H)  Epinephrine, 24H, Ur Latest Ref Range: 2 - 24 mcg/24 h 11  Metanephrine, Ur Latest Ref Range: 115 - 695 mcg/24 h 787 (H)  Metanephrines, Ur Latest Ref Range: 36 - 190 mcg/24 h 184  Norepinephrine, 24H, Ur Latest Ref Range: 15 - 100 mcg/24 h 61  Total Volume Latest Units: mL  1,250  Creatinine, Urine mg/day-CATEUR Latest Ref Range: 0.50 - 2.15 g/24 h 2.19 (H)  Normetanephr.,U,24h Latest Ref Range: 35 - 482 mcg/24 h 603 (H)  Volume, Urine-VMAUR Latest Units: mL 1,250   I have also reviewed both the CT scan and MRI.  I concur with the radiologist findings, consistent with a large left adrenal myelolipoma.  CLINICAL DATA:  Right lower quadrant abdominal pain with concern for appendicitis.  EXAM: CT ABDOMEN AND PELVIS WITH CONTRAST  TECHNIQUE: Multidetector CT imaging of the abdomen and pelvis was performed using the standard protocol following bolus administration of intravenous contrast.  CONTRAST:  169mL OMNIPAQUE IOHEXOL 300 MG/ML  SOLN  COMPARISON:  None.  FINDINGS: Lower chest: The lung bases are clear. The heart size is normal.  Hepatobiliary: The liver is normal. Normal gallbladder.There is no biliary ductal dilation.  Pancreas: Normal contours without ductal dilatation. No peripancreatic fluid collection.  Spleen: No splenic laceration or hematoma.  Adrenals/Urinary Tract:  --Adrenal glands: The right adrenal gland is unremarkable. In the region of the left adrenal gland there is a complex primarily fat containing lesion measuring approximately 12.8 x 7.2 by 9.6 cm. This mass contains areas of calcification and soft tissue including areas with fat fluid levels. There is mass effect on the left kidney and pancreas.  --Right kidney/ureter: No hydronephrosis or perinephric hematoma.  --Left  kidney/ureter: There is a nonobstructing stone in the interpolar region left kidney.  --Urinary bladder: Unremarkable.  Stomach/Bowel:  --Stomach/Duodenum: No hiatal hernia or other gastric abnormality. Normal duodenal course and caliber.  --Small bowel: No dilatation or inflammation.  --Colon: No focal abnormality.  --Appendix: Normal.  Vascular/Lymphatic: Normal course and caliber of the major abdominal vessels.  --No retroperitoneal lymphadenopathy.  --No mesenteric lymphadenopathy.  --No pelvic or inguinal lymphadenopathy.  Reproductive: Unremarkable  Other: No ascites or free air. The abdominal wall is normal.  Musculoskeletal. No acute displaced fractures.  IMPRESSION: 1. No acute abdominopelvic abnormality. Specifically, the appendix is normal. 2. There is a complex primarily fat containing lesion that appears to be centered in the left adrenal gland measuring up to 12.8 cm, containing areas of calcification and soft tissue. There is mass effect on the left kidney and pancreas. 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 However, given its complexity, an outpatient nonemergent contrast enhanced MRI is recommended for further evaluation. 3. Nonobstructive left nephrolithiasis.  CLINICAL DATA:  Evaluate adrenal mass  EXAM: MRI ABDOMEN WITHOUT CONTRAST  TECHNIQUE: Multiplanar multisequence MR imaging was performed without the administration of intravenous contrast.  COMPARISON:  CT AP 11/03/19  FINDINGS: Lower chest: No acute findings.  Hepatobiliary: There is mild diffuse hepatic steatosis. Several, milli metric cystic lesions within right hepatic lobe likely represent benign cysts. No suspicious liver abnormality. The gallbladder is unremarkable.  Pancreas: No pancreatic inflammation or mass. Body and tail of pancreas are up lifted and displaced anteriorly by large left adrenal mass.  Spleen:  Within normal limits in  size and appearance.  Adrenals/Urinary Tract: Corresponding to the CT abnormality is a the large left adrenal mass which is predominantly fat containing a few foci of soft tissue attenuation as well as calcification. This measures 10.3 x 7.8 cm, image 19/36. Previously this was measured at 12.8 x 7.2 cm. As noted previously there is mass effect upon the surrounding retroperitoneal structures with inferior displacement of the left kidney.  Normal appearance of the right adrenal gland. No kidney mass or hydronephrosis identified bilaterally.  Stomach/Bowel: Visualized portions within the abdomen are unremarkable.  Vascular/Lymphatic: No pathologically enlarged lymph nodes identified. No abdominal aortic aneurysm demonstrated.  Other:  None.  Musculoskeletal: No suspicious bone lesions identified.  IMPRESSION: 1. 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 2. Hepatic steatosis.  Assessment This is a 29 year old woman who underwent imaging for other reasons but was found to have a large left adrenal mass.  The imaging characteristics are most consistent with a myelolipoma and biochemical testing is negative for hyperfunction.  I explained to Terri Cochran that myelolipomas are benign entities, and that were this a smaller lesion, we could probably leave it in situ.  However, due to the size of the mass and the effect it is having upon the surrounding organs, I have recommended that she undergo surgical resection.  Plan Unfortunately, due to the size of the lesion, it is not feasible to remove it laparoscopically; the usually cited limits are up to 8 cm and this has been measured at 10 and 12 cm (variation between imaging modalities is not uncommon, and typically size is underestimated).  Therefore, she will need to undergo an open left adrenalectomy.  I discussed the risks of the operation with her.  These include, but are not  limited to, bleeding, infection, damage to surrounding structures, unanticipated intraoperative findings that might necessitate excision of other organs, such as the tail of the pancreas or spleen, postoperative pain, risk of hernia formation in the incision site, need for additional intraoperative procedures.  She is aware, that she will be unable to drive for 2 to 3 weeks after her operation and unable to lift anything heavier than 10 pounds for 6 weeks following her surgery.  She would like to proceed with scheduling the operation at the soonest possible available date.     Fredirick Maudlin 12/31/2019, 12:15 PM

## 2019-12-31 NOTE — H&P (View-Only) (Signed)
Patient ID: Terri Cochran, female   DOB: [birth year], 29 y.o.   MRN: [id number]  Chief Complaint  Patient presents with  . New Patient (Initial Visit)     new pt ref Dr.Shamleffer Myelolipoma left adrenal gland    HPI Terri Cochran is a 29 y.o. female.   She has been referred to me by her endocrinologist, Dr. Kelton Pillar, for surgical management of a large left adrenal myelolipoma.  Terri Cochran initially underwent a CT scan of the abdomen and pelvis to evaluate right lower quadrant pain that she had been experiencing.  The imaging was negative for appendicitis but did demonstrate a large mass involving the left adrenal gland.  Imaging characteristics were most consistent with a myelolipoma.  Subsequent MRI also confirmed this as the likely diagnosis.  She was referred to endocrinology where she underwent thorough biochemical testing.  This was negative for hypersecretion.  The mass is quite large, around 12 cm, and is displacing the kidney and pancreas.  Due to the mass effect presented by the lesion, she has been referred for surgical excision.  Terri Cochran has a number of complaints today, none of which seem related to this mass, predominantly right flank and back pain.  She is being evaluated for fertility issues and possible polycystic ovary disease.  She does not have any heart palpitations.  No syncope or near syncope.  No concern for difficult to control hypertension or issues with poorly controlled blood sugar.  No proximal muscle weakness or centripetal weight gain.  No significant issues with hypokalemia or hypernatremia.  No complaints of masculinization/hirsutism.   Past Medical History:  Diagnosis Date  . Anxiety   . Depression   . Deviated septum   . GERD (gastroesophageal reflux disease)   . Headache     Past Surgical History:  Procedure Laterality Date  . BREAST REDUCTION SURGERY    . NASAL SEPTUM SURGERY    . TONSILLECTOMY    . WISDOM TOOTH EXTRACTION      Family  History  Problem Relation Age of Onset  . Heart attack Father     Social History Social History   Tobacco Use  . Smoking status: Never Smoker  . Smokeless tobacco: Never Used  Substance Use Topics  . Alcohol use: Not Currently  . Drug use: Not Currently    No Known Allergies  Current Outpatient Medications  Medication Sig Dispense Refill  . prenatal vitamin w/FE, FA (PRENATAL 1 + 1) 27-1 MG TABS tablet Take 1 tablet by mouth daily at 12 noon. 30 each 12   No current facility-administered medications for this visit.    Review of Systems Review of Systems  Constitutional: Positive for fatigue.  Musculoskeletal: Positive for back pain.  All other systems reviewed and are negative.   Blood pressure 127/88, pulse 92, temperature 97.7 F (36.5 C), temperature source Temporal, resp. rate 12, height 5\' 6"  (1.676 m), weight 241 lb 3.2 oz (109.4 kg), SpO2 98 %, unknown if currently breastfeeding. Body mass index is 38.93 kg/m.  Physical Exam Physical Exam Constitutional:      General: She is not in acute distress.    Appearance: Normal appearance. She is obese.  HENT:     Head: Normocephalic and atraumatic.     Comments: No facial plethora or "moon facies."  Normal female distribution of hair.    Nose:     Comments: Covered with a mask secondary to COVID-19 precautions    Mouth/Throat:     Comments:  Covered with a mask secondary to COVID-19 precautions Eyes:     General: No scleral icterus.       Right eye: No discharge.        Left eye: No discharge.     Conjunctiva/sclera: Conjunctivae normal.  Neck:     Comments: No enlargement in the dorsocervical fat pad. Cardiovascular:     Rate and Rhythm: Normal rate and regular rhythm.     Pulses: Normal pulses.  Pulmonary:     Effort: Pulmonary effort is normal.     Breath sounds: Normal breath sounds.  Abdominal:     General: Bowel sounds are normal. There is no distension.     Palpations: Abdomen is soft. There is no  mass.     Comments: No violaceous striae.  Genitourinary:    Comments: Deferred Musculoskeletal:        General: No swelling.     Cervical back: No rigidity.     Right lower leg: No edema.     Left lower leg: No edema.  Lymphadenopathy:     Cervical: No cervical adenopathy.  Skin:    General: Skin is warm and dry.  Neurological:     General: No focal deficit present.     Mental Status: She is alert and oriented to person, place, and time.  Psychiatric:        Mood and Affect: Mood normal.     Data Reviewed I reviewed all of the laboratory studies that have been performed.  Although there are mild elevations in some of the catecholamines/metanephrines, the do not reach the criteria necessary to establish a diagnosis of pheochromocytoma.  Cortisol studies are also normal.  Aldosterone is within normal range; renin value not available. Results for Terri, Cochran (MRN [id number]) as of 12/31/2019 12:20  Ref. Range 12/03/2019 10:58 12/10/2019 09:20  ALDOSTERONE Latest Ref Range: 0.0 - 30.0 ng/dL  11.3  DHEA-SO4 Latest Ref Range: 84.8 - 378.0 ug/dL 280.0   Renin Latest Units: ng/mL/hr  CANCELED  LH Latest Units: mIU/mL 16.3   FSH Latest Units: mIU/mL 4.0   Prolactin Latest Ref Range: 4.8 - 23.3 ng/mL 23.1   Glucose, Plasma Latest Ref Range: 65 - 99 mg/dL 94   Sex Horm Binding Glob, Serum Latest Ref Range: 24.6 - 122.0 nmol/L 18.6 (L)   Testosterone Latest Ref Range: 8 - 48 ng/dL 73 (H)   Testosterone Free Latest Ref Range: 0.0 - 4.2 pg/mL 3.7   INSULIN Latest Ref Range: 2.6 - 24.9 uIU/mL 21.2   TSH Latest Ref Range: 0.450 - 4.500 uIU/mL 1.200   Results for Terri, Cochran (MRN [id number]) as of 12/31/2019 12:20  Ref. Range 12/12/2019 09:20  24 Hour urine volume (VMAHVA) Latest Units: mL 1,250  Calc Total (E+NE) Latest Ref Range: 26 - 121 mcg/24 h 72  Cortisol (Ur), Free Latest Ref Range: 4.0 - 50.0 mcg/24 h 21.3  Results received Latest Ref Range: 0.50 - 2.15 g/24 h 2.15   Results  for Terri, Cochran (MRN [id number]) as of 12/31/2019 12:20  Ref. Range 12/12/2019 09:20  Dopamine 24 Hr Urine Latest Ref Range: 52 - 480 mcg/24 h 532 (H)  Epinephrine, 24H, Ur Latest Ref Range: 2 - 24 mcg/24 h 11  Metanephrine, Ur Latest Ref Range: 115 - 695 mcg/24 h 787 (H)  Metanephrines, Ur Latest Ref Range: 36 - 190 mcg/24 h 184  Norepinephrine, 24H, Ur Latest Ref Range: 15 - 100 mcg/24 h 61  Total Volume Latest Units: mL  1,250  Creatinine, Urine mg/day-CATEUR Latest Ref Range: 0.50 - 2.15 g/24 h 2.19 (H)  Normetanephr.,U,24h Latest Ref Range: 35 - 482 mcg/24 h 603 (H)  Volume, Urine-VMAUR Latest Units: mL 1,250   I have also reviewed both the CT scan and MRI.  I concur with the radiologist findings, consistent with a large left adrenal myelolipoma.  CLINICAL DATA:  Right lower quadrant abdominal pain with concern for appendicitis.  EXAM: CT ABDOMEN AND PELVIS WITH CONTRAST  TECHNIQUE: Multidetector CT imaging of the abdomen and pelvis was performed using the standard protocol following bolus administration of intravenous contrast.  CONTRAST:  135mL OMNIPAQUE IOHEXOL 300 MG/ML  SOLN  COMPARISON:  None.  FINDINGS: Lower chest: The lung bases are clear. The heart size is normal.  Hepatobiliary: The liver is normal. Normal gallbladder.There is no biliary ductal dilation.  Pancreas: Normal contours without ductal dilatation. No peripancreatic fluid collection.  Spleen: No splenic laceration or hematoma.  Adrenals/Urinary Tract:  --Adrenal glands: The right adrenal gland is unremarkable. In the region of the left adrenal gland there is a complex primarily fat containing lesion measuring approximately 12.8 x 7.2 by 9.6 cm. This mass contains areas of calcification and soft tissue including areas with fat fluid levels. There is mass effect on the left kidney and pancreas.  --Right kidney/ureter: No hydronephrosis or perinephric hematoma.  --Left  kidney/ureter: There is a nonobstructing stone in the interpolar region left kidney.  --Urinary bladder: Unremarkable.  Stomach/Bowel:  --Stomach/Duodenum: No hiatal hernia or other gastric abnormality. Normal duodenal course and caliber.  --Small bowel: No dilatation or inflammation.  --Colon: No focal abnormality.  --Appendix: Normal.  Vascular/Lymphatic: Normal course and caliber of the major abdominal vessels.  --No retroperitoneal lymphadenopathy.  --No mesenteric lymphadenopathy.  --No pelvic or inguinal lymphadenopathy.  Reproductive: Unremarkable  Other: No ascites or free air. The abdominal wall is normal.  Musculoskeletal. No acute displaced fractures.  IMPRESSION: 1. No acute abdominopelvic abnormality. Specifically, the appendix is normal. 2. There is a complex primarily fat containing lesion that appears to be centered in the left adrenal gland measuring up to 12.8 cm, containing areas of calcification and soft tissue. There is mass effect on the left kidney and pancreas. 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 However, given its complexity, an outpatient nonemergent contrast enhanced MRI is recommended for further evaluation. 3. Nonobstructive left nephrolithiasis.  CLINICAL DATA:  Evaluate adrenal mass  EXAM: MRI ABDOMEN WITHOUT CONTRAST  TECHNIQUE: Multiplanar multisequence MR imaging was performed without the administration of intravenous contrast.  COMPARISON:  CT AP 11/03/19  FINDINGS: Lower chest: No acute findings.  Hepatobiliary: There is mild diffuse hepatic steatosis. Several, milli metric cystic lesions within right hepatic lobe likely represent benign cysts. No suspicious liver abnormality. The gallbladder is unremarkable.  Pancreas: No pancreatic inflammation or mass. Body and tail of pancreas are up lifted and displaced anteriorly by large left adrenal mass.  Spleen:  Within normal limits in  size and appearance.  Adrenals/Urinary Tract: Corresponding to the CT abnormality is a the large left adrenal mass which is predominantly fat containing a few foci of soft tissue attenuation as well as calcification. This measures 10.3 x 7.8 cm, image 19/36. Previously this was measured at 12.8 x 7.2 cm. As noted previously there is mass effect upon the surrounding retroperitoneal structures with inferior displacement of the left kidney.  Normal appearance of the right adrenal gland. No kidney mass or hydronephrosis identified bilaterally.  Stomach/Bowel: Visualized portions within the abdomen are unremarkable.  Vascular/Lymphatic: No pathologically enlarged lymph nodes identified. No abdominal aortic aneurysm demonstrated.  Other:  None.  Musculoskeletal: No suspicious bone lesions identified.  IMPRESSION: 1. 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 2. Hepatic steatosis.  Assessment This is a 29 year old woman who underwent imaging for other reasons but was found to have a large left adrenal mass.  The imaging characteristics are most consistent with a myelolipoma and biochemical testing is negative for hyperfunction.  I explained to Terri Cochran that myelolipomas are benign entities, and that were this a smaller lesion, we could probably leave it in situ.  However, due to the size of the mass and the effect it is having upon the surrounding organs, I have recommended that she undergo surgical resection.  Plan Unfortunately, due to the size of the lesion, it is not feasible to remove it laparoscopically; the usually cited limits are up to 8 cm and this has been measured at 10 and 12 cm (variation between imaging modalities is not uncommon, and typically size is underestimated).  Therefore, she will need to undergo an open left adrenalectomy.  I discussed the risks of the operation with her.  These include, but are not  limited to, bleeding, infection, damage to surrounding structures, unanticipated intraoperative findings that might necessitate excision of other organs, such as the tail of the pancreas or spleen, postoperative pain, risk of hernia formation in the incision site, need for additional intraoperative procedures.  She is aware, that she will be unable to drive for 2 to 3 weeks after her operation and unable to lift anything heavier than 10 pounds for 6 weeks following her surgery.  She would like to proceed with scheduling the operation at the soonest possible available date.     Fredirick Maudlin 12/31/2019, 12:15 PM

## 2020-01-01 ENCOUNTER — Telehealth: Payer: Self-pay | Admitting: General Surgery

## 2020-01-01 NOTE — Telephone Encounter (Signed)
Pt has been advised of pre admission date/time, Covid Testing date and Surgery date.  Surgery Date: 01/08/20 Preadmission Testing Date: 01/03/20 (1p-4p) Covid Testing Date: 01/06/20 - patient advised to go to the [facility] ([place])  Patient has been made aware to call [phone], between 1-3:00pm the day before surgery, to find out what time to arrive.

## 2020-01-03 ENCOUNTER — Encounter
Admission: RE | Admit: 2020-01-03 | Discharge: 2020-01-03 | Disposition: A | Discharge status: Home / Self Care | Payer: 59 | Source: Ambulatory Visit | Attending: General Surgery | Admitting: General Surgery

## 2020-01-03 ENCOUNTER — Other Ambulatory Visit: Payer: Self-pay

## 2020-01-03 DIAGNOSIS — Z01818 Encounter for other preprocedural examination: Secondary | ICD-10-CM | POA: Insufficient documentation

## 2020-01-03 HISTORY — DX: Other complications of anesthesia, initial encounter: T88.59XA

## 2020-01-03 NOTE — Patient Instructions (Signed)
Your procedure is scheduled on: 01-08-20 [facility] Report to Same Day Surgery 2nd floor medical mall [facility] Entrance-take elevator on left to 2nd floor.  Check in with surgery information desk.) To find out your arrival time please call [phone] between 1PM - 3PM on 01-07-20 TUESDAY  Remember: Instructions that are not followed completely may result in serious medical risk, up to and including death, or upon the discretion of your surgeon and anesthesiologist your surgery may need to be rescheduled.    _x___ 1. Do not eat food after midnight the night before your procedure. NO GUM OR CANDY AFTER MIDNIGHT. You may drink clear liquids up to 2 hours before you are scheduled to arrive at the hospital for your procedure.  Do not drink clear liquids within 2 hours of your scheduled arrival to the hospital.  Clear liquids include  --Water or Apple juice without pulp  --Gatorade  --Black Coffee or Clear Tea (No milk, no creamers, do not add anything to the coffee or Tea   ____Ensure clear carbohydrate drink on the way to the hospital for bariatric patients  ____Ensure clear carbohydrate drink 3 hours before surgery.    __x__ 2. No Alcohol for 24 hours before or after surgery.   __x__3. No Smoking or e-cigarettes for 24 prior to surgery.  Do not use any chewable tobacco products for at least 6 hour prior to surgery   ____  4. Bring all medications with you on the day of surgery if instructed.    __x__ 5. Notify your doctor if there is any change in your medical condition     (cold, fever, infections).    x___6. On the morning of surgery brush your teeth with toothpaste and water.  You may rinse your mouth with mouth wash if you wish.  Do not swallow any toothpaste or mouthwash.   Do not wear jewelry, make-up, hairpins, clips or nail polish.  Do not wear lotions, powders, or perfumes.   Do not shave 48 hours prior to surgery. Men may shave face and neck.  Do not bring valuables to  the hospital.    [facility] is not responsible for any belongings or valuables.               Contacts, dentures or bridgework may not be worn into surgery.  Leave your suitcase in the car. After surgery it may be brought to your room.  For patients admitted to the hospital, discharge time is determined by your treatment team.  _  Patients discharged the day of surgery will not be allowed to drive home.  You will need someone to drive you home and stay with you the night of your procedure.    Please read over the following fact sheets that you were given:   [facility] Preparing for Surgery    ____ Take anti-hypertensive listed below, cardiac, seizure, asthma, anti-reflux and psychiatric medicines. These include:  1. NONE  2.  3.  4.  5.  6.  ____Fleets enema or Magnesium Citrate as directed.   _x___ Use CHG Soap or sage wipes as directed on instruction sheet   ____ Use inhalers on the day of surgery and bring to hospital day of surgery  ____ Stop Metformin and Janumet 2 days prior to surgery.    ____ Take 1/2 of usual insulin dose the night before surgery and none on the morning surgery.   ____ Follow recommendations from Cardiologist, Pulmonologist or PCP regarding stopping Aspirin, Coumadin,  Plavix ,Eliquis, Effient, or Pradaxa, and Pletal.  X____Stop Anti-inflammatories such as Advil, Aleve, Ibuprofen, Motrin, Naproxen, Naprosyn, Goodies powders or aspirin products NOW-OK to take Tylenol    _X___ Stop supplements until after surgery-STOP MELATONIN NOW-MAY RESUME AFTER SURGERY   ____ Bring C-Pap to the hospital.

## 2020-01-06 ENCOUNTER — Other Ambulatory Visit
Admission: RE | Admit: 2020-01-06 | Discharge: 2020-01-06 | Disposition: A | Discharge status: Home / Self Care | Payer: 59 | Source: Ambulatory Visit | Attending: General Surgery | Admitting: General Surgery

## 2020-01-06 ENCOUNTER — Other Ambulatory Visit: Payer: 59

## 2020-01-06 ENCOUNTER — Other Ambulatory Visit: Payer: Self-pay

## 2020-01-06 ENCOUNTER — Telehealth: Payer: Self-pay | Admitting: General Surgery

## 2020-01-06 DIAGNOSIS — Z01812 Encounter for preprocedural laboratory examination: Secondary | ICD-10-CM | POA: Insufficient documentation

## 2020-01-06 DIAGNOSIS — Z20822 Contact with and (suspected) exposure to covid-19: Secondary | ICD-10-CM | POA: Insufficient documentation

## 2020-01-06 LAB — TYPE AND SCREEN
ABO/RH(D): B POS
Antibody Screen: NEGATIVE

## 2020-01-06 NOTE — Telephone Encounter (Signed)
Patient is calling and just has a question she is having surgery done on Wednesday and pre-admission told the patient at least a week after surgery she shouldn't take the stairs, patient is calling here just making sure patient said its spiral steps and it's about 20. Please call patient and advise.

## 2020-01-06 NOTE — Telephone Encounter (Signed)
Spoke with patient and she has spiral staircase in her home. We discussed continuing to use the stairs but to be cautious and not try to run up or run down them. Especially when taking medications that could cause dizziness.

## 2020-01-07 LAB — SARS CORONAVIRUS 2 (TAT 6-24 HRS): SARS Coronavirus 2: NEGATIVE

## 2020-01-09 ENCOUNTER — Ambulatory Visit: Payer: 59 | Admitting: Gastroenterology

## 2020-01-21 LAB — HM PAP SMEAR

## 2020-01-22 ENCOUNTER — Other Ambulatory Visit
Admission: RE | Admit: 2020-01-22 | Discharge: 2020-01-22 | Disposition: A | Discharge status: Home / Self Care | Payer: 59 | Source: Ambulatory Visit | Attending: General Surgery | Admitting: General Surgery

## 2020-01-22 ENCOUNTER — Other Ambulatory Visit: Payer: Self-pay

## 2020-01-22 LAB — SARS CORONAVIRUS 2 (TAT 6-24 HRS): SARS Coronavirus 2: NEGATIVE

## 2020-01-24 ENCOUNTER — Inpatient Hospital Stay
Admission: RE | Admit: 2020-01-24 | Discharge: 2020-01-28 | DRG: 615 | Disposition: A | Discharge status: Home / Self Care | Payer: 59 | Source: Ambulatory Visit | Attending: General Surgery | Admitting: General Surgery

## 2020-01-24 ENCOUNTER — Inpatient Hospital Stay: Payer: 59 | Admitting: Anesthesiology

## 2020-01-24 ENCOUNTER — Encounter
Admission: RE | Disposition: A | Discharge status: Home / Self Care | Payer: Self-pay | Source: Ambulatory Visit | Attending: General Surgery

## 2020-01-24 ENCOUNTER — Other Ambulatory Visit: Payer: Self-pay

## 2020-01-24 ENCOUNTER — Encounter: Payer: Self-pay | Admitting: General Surgery

## 2020-01-24 DIAGNOSIS — K760 Fatty (change of) liver, not elsewhere classified: Secondary | ICD-10-CM | POA: Diagnosis present

## 2020-01-24 DIAGNOSIS — F419 Anxiety disorder, unspecified: Secondary | ICD-10-CM | POA: Diagnosis present

## 2020-01-24 DIAGNOSIS — R000 Tachycardia, unspecified: Secondary | ICD-10-CM | POA: Diagnosis not present

## 2020-01-24 DIAGNOSIS — E279 Disorder of adrenal gland, unspecified: Secondary | ICD-10-CM | POA: Diagnosis present

## 2020-01-24 DIAGNOSIS — D4412 Neoplasm of uncertain behavior of left adrenal gland: Secondary | ICD-10-CM | POA: Diagnosis present

## 2020-01-24 DIAGNOSIS — G8918 Other acute postprocedural pain: Secondary | ICD-10-CM | POA: Diagnosis present

## 2020-01-24 DIAGNOSIS — R519 Headache, unspecified: Secondary | ICD-10-CM | POA: Diagnosis present

## 2020-01-24 DIAGNOSIS — E278 Other specified disorders of adrenal gland: Secondary | ICD-10-CM

## 2020-01-24 DIAGNOSIS — Z8249 Family history of ischemic heart disease and other diseases of the circulatory system: Secondary | ICD-10-CM | POA: Diagnosis not present

## 2020-01-24 DIAGNOSIS — Z20822 Contact with and (suspected) exposure to covid-19: Secondary | ICD-10-CM | POA: Diagnosis present

## 2020-01-24 DIAGNOSIS — J342 Deviated nasal septum: Secondary | ICD-10-CM | POA: Diagnosis present

## 2020-01-24 DIAGNOSIS — F329 Major depressive disorder, single episode, unspecified: Secondary | ICD-10-CM | POA: Diagnosis present

## 2020-01-24 DIAGNOSIS — K219 Gastro-esophageal reflux disease without esophagitis: Secondary | ICD-10-CM | POA: Diagnosis present

## 2020-01-24 DIAGNOSIS — D497 Neoplasm of unspecified behavior of endocrine glands and other parts of nervous system: Secondary | ICD-10-CM | POA: Diagnosis present

## 2020-01-24 HISTORY — PX: ADRENALECTOMY: SHX876

## 2020-01-24 LAB — CBC
HCT: 36.3 % (ref 36.0–46.0)
Hemoglobin: 11.7 g/dL — ABNORMAL LOW (ref 12.0–15.0)
MCH: 26.2 pg (ref 26.0–34.0)
MCHC: 32.2 g/dL (ref 30.0–36.0)
MCV: 81.4 fL (ref 80.0–100.0)
Platelets: 313 10*3/uL (ref 150–400)
RBC: 4.46 MIL/uL (ref 3.87–5.11)
RDW: 14.8 % (ref 11.5–15.5)
WBC: 12 10*3/uL — ABNORMAL HIGH (ref 4.0–10.5)
nRBC: 0 % (ref 0.0–0.2)

## 2020-01-24 LAB — TYPE AND SCREEN
ABO/RH(D): B POS
Antibody Screen: NEGATIVE

## 2020-01-24 LAB — CREATININE, SERUM
Creatinine, Ser: 1.1 mg/dL — ABNORMAL HIGH (ref 0.44–1.00)
GFR calc Af Amer: >60 mL/min (ref >60)
GFR calc non Af Amer: >60 mL/min (ref >60)

## 2020-01-24 LAB — POCT PREGNANCY, URINE: Preg Test, Ur: NEGATIVE

## 2020-01-24 SURGERY — ADRENALECTOMY
Anesthesia: General | Site: Abdomen | Laterality: Left

## 2020-01-24 MED ORDER — TAMSULOSIN HCL 0.4 MG PO CAPS
0.4000 mg | ORAL_CAPSULE | Freq: Two times a day (BID) | ORAL | Status: AC
  Administered 2020-01-24 – 2020-01-25 (×4): 0.4 mg via ORAL
  Filled 2020-01-24 (×4): qty 1

## 2020-01-24 MED ORDER — LACTATED RINGERS IV SOLN: INTRAVENOUS | Status: DC

## 2020-01-24 MED ORDER — ROCURONIUM BROMIDE 50 MG/5ML IV SOLN
INTRAVENOUS | Status: AC
  Filled 2020-01-24: qty 1

## 2020-01-24 MED ORDER — CHLORHEXIDINE GLUCONATE CLOTH 2 % EX PADS
6.0000 | MEDICATED_PAD | Freq: Once | CUTANEOUS | Status: AC
  Administered 2020-01-24: 6 via TOPICAL

## 2020-01-24 MED ORDER — MIDAZOLAM HCL 2 MG/2ML IJ SOLN
INTRAMUSCULAR | Status: AC
  Filled 2020-01-24: qty 2

## 2020-01-24 MED ORDER — MELATONIN 5 MG PO TABS: 10.0000 mg | ORAL_TABLET | Freq: Every evening | ORAL | Status: DC | PRN

## 2020-01-24 MED ORDER — CELECOXIB 200 MG PO CAPS: 200.0000 mg | ORAL_CAPSULE | ORAL | Status: AC

## 2020-01-24 MED ORDER — ONDANSETRON HCL 4 MG/2ML IJ SOLN
INTRAMUSCULAR | Status: AC
  Filled 2020-01-24: qty 2

## 2020-01-24 MED ORDER — DEXMEDETOMIDINE HCL IN NACL 400 MCG/100ML IV SOLN
INTRAVENOUS | Status: DC | PRN
  Administered 2020-01-24 (×2): 8 ug via INTRAVENOUS
  Administered 2020-01-24: 4 ug via INTRAVENOUS

## 2020-01-24 MED ORDER — ONDANSETRON HCL 4 MG/2ML IJ SOLN: 4.0000 mg | Freq: Once | INTRAMUSCULAR | Status: DC | PRN

## 2020-01-24 MED ORDER — PROPOFOL 10 MG/ML IV BOLUS
INTRAVENOUS | Status: DC | PRN
  Administered 2020-01-24: 50 mg via INTRAVENOUS
  Administered 2020-01-24: 150 mg via INTRAVENOUS

## 2020-01-24 MED ORDER — MIDAZOLAM HCL 2 MG/2ML IJ SOLN
INTRAMUSCULAR | Status: DC | PRN
  Administered 2020-01-24: 2 mg via INTRAVENOUS

## 2020-01-24 MED ORDER — DIPHENHYDRAMINE HCL 50 MG/ML IJ SOLN
INTRAMUSCULAR | Status: DC | PRN
  Administered 2020-01-24: 25 mg via INTRAVENOUS

## 2020-01-24 MED ORDER — ACETAMINOPHEN 500 MG PO TABS: 1000.0000 mg | ORAL_TABLET | ORAL | Status: AC

## 2020-01-24 MED ORDER — SUGAMMADEX SODIUM 200 MG/2ML IV SOLN
INTRAVENOUS | Status: AC
  Filled 2020-01-24: qty 2

## 2020-01-24 MED ORDER — BUPIVACAINE HCL (PF) 0.5 % IJ SOLN
INTRAMUSCULAR | Status: DC | PRN
  Administered 2020-01-24: 30 mL

## 2020-01-24 MED ORDER — FENTANYL CITRATE (PF) 100 MCG/2ML IJ SOLN
INTRAMUSCULAR | Status: DC | PRN
  Administered 2020-01-24 (×5): 50 ug via INTRAVENOUS

## 2020-01-24 MED ORDER — ACETAMINOPHEN 500 MG PO TABS
1000.0000 mg | ORAL_TABLET | Freq: Four times a day (QID) | ORAL | Status: DC
  Administered 2020-01-24: 1000 mg via ORAL
  Filled 2020-01-24: qty 2

## 2020-01-24 MED ORDER — DIPHENHYDRAMINE HCL 50 MG/ML IJ SOLN
INTRAMUSCULAR | Status: AC
  Filled 2020-01-24: qty 1

## 2020-01-24 MED ORDER — BUPIVACAINE-EPINEPHRINE (PF) 0.5% -1:200000 IJ SOLN
INTRAMUSCULAR | Status: AC
  Filled 2020-01-24: qty 30

## 2020-01-24 MED ORDER — BUPIVACAINE LIPOSOME 1.3 % IJ SUSP: 20.0000 mL | Freq: Once | INTRAMUSCULAR | Status: DC

## 2020-01-24 MED ORDER — CELECOXIB 200 MG PO CAPS
200.0000 mg | ORAL_CAPSULE | Freq: Two times a day (BID) | ORAL | Status: DC
  Administered 2020-01-24 – 2020-01-25 (×2): 200 mg via ORAL
  Filled 2020-01-24 (×3): qty 1

## 2020-01-24 MED ORDER — ENOXAPARIN SODIUM 40 MG/0.4ML ~~LOC~~ SOLN
40.0000 mg | SUBCUTANEOUS | Status: DC
  Administered 2020-01-25 – 2020-01-27 (×3): 40 mg via SUBCUTANEOUS
  Filled 2020-01-24 (×3): qty 0.4

## 2020-01-24 MED ORDER — SIMETHICONE 80 MG PO CHEW: 40.0000 mg | CHEWABLE_TABLET | Freq: Four times a day (QID) | ORAL | Status: DC | PRN

## 2020-01-24 MED ORDER — FAMOTIDINE 20 MG PO TABS: 20.0000 mg | ORAL_TABLET | Freq: Once | ORAL | Status: AC

## 2020-01-24 MED ORDER — FENTANYL CITRATE (PF) 100 MCG/2ML IJ SOLN
25.0000 ug | INTRAMUSCULAR | Status: DC | PRN
  Administered 2020-01-24 (×3): 25 ug via INTRAVENOUS

## 2020-01-24 MED ORDER — BUPIVACAINE LIPOSOME 1.3 % IJ SUSP
INTRAMUSCULAR | Status: AC
  Filled 2020-01-24: qty 20

## 2020-01-24 MED ORDER — LACTATED RINGERS IV SOLN: INTRAVENOUS | Status: DC | PRN

## 2020-01-24 MED ORDER — FAMOTIDINE 20 MG PO TABS
ORAL_TABLET | ORAL | Status: AC
  Administered 2020-01-24: 20 mg via ORAL
  Filled 2020-01-24: qty 1

## 2020-01-24 MED ORDER — FENTANYL CITRATE (PF) 100 MCG/2ML IJ SOLN
INTRAMUSCULAR | Status: AC
  Administered 2020-01-24: 25 ug via INTRAVENOUS
  Filled 2020-01-24: qty 2

## 2020-01-24 MED ORDER — ONDANSETRON HCL 4 MG/2ML IJ SOLN
4.0000 mg | Freq: Four times a day (QID) | INTRAMUSCULAR | Status: DC | PRN
  Administered 2020-01-25: 4 mg via INTRAVENOUS
  Filled 2020-01-24: qty 2

## 2020-01-24 MED ORDER — METOPROLOL TARTRATE 5 MG/5ML IV SOLN: 5.0000 mg | Freq: Four times a day (QID) | INTRAVENOUS | Status: DC | PRN

## 2020-01-24 MED ORDER — SUCCINYLCHOLINE CHLORIDE 20 MG/ML IJ SOLN
INTRAMUSCULAR | Status: AC
  Filled 2020-01-24: qty 1

## 2020-01-24 MED ORDER — CELECOXIB 200 MG PO CAPS
ORAL_CAPSULE | ORAL | Status: AC
  Administered 2020-01-24: 200 mg via ORAL
  Filled 2020-01-24: qty 1

## 2020-01-24 MED ORDER — DIPHENHYDRAMINE HCL 25 MG PO CAPS
25.0000 mg | ORAL_CAPSULE | Freq: Every evening | ORAL | Status: DC | PRN
  Administered 2020-01-25 – 2020-01-26 (×2): 25 mg via ORAL
  Filled 2020-01-24 (×2): qty 1

## 2020-01-24 MED ORDER — GABAPENTIN 300 MG PO CAPS
ORAL_CAPSULE | ORAL | Status: AC
  Administered 2020-01-24: 300 mg via ORAL
  Filled 2020-01-24: qty 1

## 2020-01-24 MED ORDER — DIPHENHYDRAMINE-APAP (SLEEP) 25-500 MG PO TABS: 2.0000 | ORAL_TABLET | Freq: Every evening | ORAL | Status: DC | PRN

## 2020-01-24 MED ORDER — OXYCODONE HCL 5 MG PO TABS
5.0000 mg | ORAL_TABLET | ORAL | Status: DC | PRN
  Administered 2020-01-25: 10 mg via ORAL
  Filled 2020-01-24: qty 2

## 2020-01-24 MED ORDER — ACETAMINOPHEN 500 MG PO TABS
ORAL_TABLET | ORAL | Status: AC
  Administered 2020-01-24: 1000 mg via ORAL
  Filled 2020-01-24: qty 2

## 2020-01-24 MED ORDER — SODIUM CHLORIDE 0.9 % IV SOLN
INTRAVENOUS | Status: DC | PRN
  Administered 2020-01-24: 70 mL

## 2020-01-24 MED ORDER — GABAPENTIN 300 MG PO CAPS: 300.0000 mg | ORAL_CAPSULE | ORAL | Status: AC

## 2020-01-24 MED ORDER — PROPOFOL 10 MG/ML IV BOLUS
INTRAVENOUS | Status: AC
  Filled 2020-01-24: qty 20

## 2020-01-24 MED ORDER — DEXMEDETOMIDINE HCL IN NACL 200 MCG/50ML IV SOLN
INTRAVENOUS | Status: AC
  Filled 2020-01-24: qty 50

## 2020-01-24 MED ORDER — ROCURONIUM BROMIDE 100 MG/10ML IV SOLN
INTRAVENOUS | Status: DC | PRN
  Administered 2020-01-24: 50 mg via INTRAVENOUS
  Administered 2020-01-24: 20 mg via INTRAVENOUS
  Administered 2020-01-24: 10 mg via INTRAVENOUS

## 2020-01-24 MED ORDER — ONDANSETRON HCL 4 MG/2ML IJ SOLN
INTRAMUSCULAR | Status: DC | PRN
  Administered 2020-01-24: 4 mg via INTRAVENOUS

## 2020-01-24 MED ORDER — HYDROMORPHONE HCL 1 MG/ML IJ SOLN
INTRAMUSCULAR | Status: AC
  Filled 2020-01-24: qty 1

## 2020-01-24 MED ORDER — ACETAMINOPHEN 500 MG PO TABS: 500.0000 mg | ORAL_TABLET | Freq: Every evening | ORAL | Status: DC | PRN

## 2020-01-24 MED ORDER — HYDROMORPHONE HCL 1 MG/ML IJ SOLN
INTRAMUSCULAR | Status: DC | PRN
  Administered 2020-01-24: .25 mg via INTRAVENOUS

## 2020-01-24 MED ORDER — EPHEDRINE SULFATE 50 MG/ML IJ SOLN
INTRAMUSCULAR | Status: AC
  Filled 2020-01-24: qty 1

## 2020-01-24 MED ORDER — OXYCODONE HCL 5 MG/5ML PO SOLN: 5.0000 mg | Freq: Once | ORAL | Status: DC | PRN

## 2020-01-24 MED ORDER — CEFAZOLIN SODIUM-DEXTROSE 2-4 GM/100ML-% IV SOLN
INTRAVENOUS | Status: AC
  Filled 2020-01-24: qty 100

## 2020-01-24 MED ORDER — DEXTROSE IN LACTATED RINGERS 5 % IV SOLN: INTRAVENOUS | Status: DC

## 2020-01-24 MED ORDER — DEXAMETHASONE SODIUM PHOSPHATE 10 MG/ML IJ SOLN
INTRAMUSCULAR | Status: DC | PRN
  Administered 2020-01-24: 5 mg via INTRAVENOUS

## 2020-01-24 MED ORDER — SODIUM CHLORIDE (PF) 0.9 % IJ SOLN
INTRAMUSCULAR | Status: AC
  Filled 2020-01-24: qty 50

## 2020-01-24 MED ORDER — CEFAZOLIN SODIUM-DEXTROSE 2-4 GM/100ML-% IV SOLN
2.0000 g | INTRAVENOUS | Status: AC
  Administered 2020-01-24: 2 g via INTRAVENOUS

## 2020-01-24 MED ORDER — DEXAMETHASONE SODIUM PHOSPHATE 10 MG/ML IJ SOLN
INTRAMUSCULAR | Status: AC
  Filled 2020-01-24: qty 1

## 2020-01-24 MED ORDER — GLYCOPYRROLATE 0.2 MG/ML IJ SOLN
INTRAMUSCULAR | Status: AC
  Filled 2020-01-24: qty 1

## 2020-01-24 MED ORDER — GABAPENTIN 300 MG PO CAPS
300.0000 mg | ORAL_CAPSULE | Freq: Two times a day (BID) | ORAL | Status: DC
  Administered 2020-01-24 – 2020-01-25 (×2): 300 mg via ORAL
  Filled 2020-01-24 (×3): qty 1

## 2020-01-24 MED ORDER — LIDOCAINE HCL (CARDIAC) PF 100 MG/5ML IV SOSY
PREFILLED_SYRINGE | INTRAVENOUS | Status: DC | PRN
  Administered 2020-01-24: 80 mg via INTRAVENOUS

## 2020-01-24 MED ORDER — METHOCARBAMOL 500 MG PO TABS: 500.0000 mg | ORAL_TABLET | Freq: Four times a day (QID) | ORAL | Status: DC | PRN

## 2020-01-24 MED ORDER — FENTANYL CITRATE (PF) 250 MCG/5ML IJ SOLN
INTRAMUSCULAR | Status: AC
  Filled 2020-01-24: qty 5

## 2020-01-24 MED ORDER — LIDOCAINE HCL (PF) 2 % IJ SOLN
INTRAMUSCULAR | Status: AC
  Filled 2020-01-24: qty 5

## 2020-01-24 MED ORDER — KETOROLAC TROMETHAMINE 30 MG/ML IJ SOLN
30.0000 mg | Freq: Four times a day (QID) | INTRAMUSCULAR | Status: DC | PRN
  Administered 2020-01-24 – 2020-01-25 (×2): 30 mg via INTRAVENOUS
  Filled 2020-01-24 (×2): qty 1

## 2020-01-24 MED ORDER — OXYCODONE HCL 5 MG PO TABS: 5.0000 mg | ORAL_TABLET | Freq: Once | ORAL | Status: DC | PRN

## 2020-01-24 MED ORDER — ACETAMINOPHEN 10 MG/ML IV SOLN: 1000.0000 mg | Freq: Once | INTRAVENOUS | Status: DC | PRN

## 2020-01-24 MED ORDER — ONDANSETRON 4 MG PO TBDP: 4.0000 mg | ORAL_TABLET | Freq: Four times a day (QID) | ORAL | Status: DC | PRN

## 2020-01-24 MED ORDER — HYDROMORPHONE HCL 1 MG/ML IJ SOLN
0.5000 mg | INTRAMUSCULAR | Status: DC | PRN
  Administered 2020-01-24 – 2020-01-25 (×2): 0.5 mg via INTRAVENOUS
  Filled 2020-01-24: qty 0.5

## 2020-01-24 MED ORDER — MAGNESIUM HYDROXIDE 400 MG/5ML PO SUSP
30.0000 mL | Freq: Every day | ORAL | Status: DC | PRN
  Administered 2020-01-26: 30 mL via ORAL
  Filled 2020-01-24: qty 30

## 2020-01-24 MED ORDER — FENTANYL CITRATE (PF) 100 MCG/2ML IJ SOLN
INTRAMUSCULAR | Status: AC
  Filled 2020-01-24: qty 2

## 2020-01-24 MED ORDER — PHENYLEPHRINE HCL (PRESSORS) 10 MG/ML IV SOLN
INTRAVENOUS | Status: DC | PRN
  Administered 2020-01-24: 100 ug via INTRAVENOUS
  Administered 2020-01-24: 50 ug via INTRAVENOUS
  Administered 2020-01-24 (×4): 100 ug via INTRAVENOUS
  Administered 2020-01-24: 150 ug via INTRAVENOUS
  Administered 2020-01-24: 50 ug via INTRAVENOUS

## 2020-01-24 MED ORDER — SUGAMMADEX SODIUM 200 MG/2ML IV SOLN
INTRAVENOUS | Status: DC | PRN
  Administered 2020-01-24: 200 mg via INTRAVENOUS

## 2020-01-24 SURGICAL SUPPLY — 61 items
APPLIER CLIP 11 MED OPEN (CLIP) ×2
APPLIER CLIP 13 LRG OPEN (CLIP)
APPLIER CLIP 9.375 SM OPEN (CLIP)
BLADE CLIPPER SURG (BLADE) ×2 IMPLANT
CANISTER SUCT 1200ML W/VALVE (MISCELLANEOUS) ×2 IMPLANT
CHLORAPREP W/TINT 26 (MISCELLANEOUS) ×2 IMPLANT
CLIP APPLIE 11 MED OPEN (CLIP) ×1 IMPLANT
CLIP APPLIE 13 LRG OPEN (CLIP) IMPLANT
CLIP APPLIE 9.375 SM OPEN (CLIP) IMPLANT
COVER TABLE BACK 60X90 (DRAPES) ×2 IMPLANT
COVER WAND RF STERILE (DRAPES) ×2 IMPLANT
CUTTER FLEX LINEAR 45M (STAPLE) IMPLANT
DERMABOND ADVANCED (GAUZE/BANDAGES/DRESSINGS)
DERMABOND ADVANCED .7 DNX12 (GAUZE/BANDAGES/DRESSINGS) IMPLANT
DRAPE LAPAROTOMY 100X77 ABD (DRAPES) ×2 IMPLANT
DRAPE MAG INST 16X20 L/F (DRAPES) ×2 IMPLANT
DRSG TELFA 4X14 ISLAND NADH (GAUZE/BANDAGES/DRESSINGS) ×2 IMPLANT
DRSG TELFA 4X8 ISLAND PHMB (GAUZE/BANDAGES/DRESSINGS) ×2 IMPLANT
ELECT CAUTERY BLADE TIP 2.5 (TIP) ×2
ELECT EZSTD 165MM 6.5IN (MISCELLANEOUS) ×2
ELECT REM PT RETURN 9FT ADLT (ELECTROSURGICAL) ×2
ELECTRODE CAUTERY BLDE TIP 2.5 (TIP) ×1 IMPLANT
ELECTRODE EZSTD 165MM 6.5IN (MISCELLANEOUS) ×1 IMPLANT
ELECTRODE REM PT RTRN 9FT ADLT (ELECTROSURGICAL) ×1 IMPLANT
GAUZE 4X4 16PLY RFD (DISPOSABLE) IMPLANT
GLOVE BIO SURGEON STRL SZ 6.5 (GLOVE) ×2 IMPLANT
GLOVE INDICATOR 7.0 STRL GRN (GLOVE) ×2 IMPLANT
GOWN STRL REUS W/ TWL LRG LVL3 (GOWN DISPOSABLE) ×5 IMPLANT
GOWN STRL REUS W/TWL LRG LVL3 (GOWN DISPOSABLE) ×5
HEMOSTAT SNOW SURGICEL 2X4 (HEMOSTASIS) IMPLANT
KIT TURNOVER KIT A (KITS) ×2 IMPLANT
LABEL OR SOLS (LABEL) ×2 IMPLANT
NDL HPO THNWL 1X22GA REG BVL (NEEDLE) ×2 IMPLANT
NEEDLE HYPO 22GX1.5 SAFETY (NEEDLE) ×2 IMPLANT
NEEDLE SAFETY 22GX1 (NEEDLE) ×2
NEEDLE SPNL 18GX3.5 QUINCKE PK (NEEDLE) ×2 IMPLANT
NS IRRIG 1000ML POUR BTL (IV SOLUTION) ×2 IMPLANT
PACK BASIN MAJOR ARMC (MISCELLANEOUS) ×2 IMPLANT
RELOAD 45 VASCULAR/THIN (ENDOMECHANICALS) IMPLANT
RETAINER VISCERA MED (MISCELLANEOUS) IMPLANT
SHEARS HARMONIC ACE PLUS 36CM (ENDOMECHANICALS) ×2 IMPLANT
SHEARS HARMONIC STRL 23CM (MISCELLANEOUS) IMPLANT
SLEEVE PROTECTION STRL DISP (MISCELLANEOUS) ×2 IMPLANT
SPONGE KITTNER 5P (MISCELLANEOUS) ×2 IMPLANT
SPONGE LAP 18X18 RF (DISPOSABLE) IMPLANT
STAPLER SKIN PROX 35W (STAPLE) ×2 IMPLANT
STRIP CLOSURE SKIN 1/2X4 (GAUZE/BANDAGES/DRESSINGS) IMPLANT
SUT PDS AB 1 CT1 36 (SUTURE) ×4 IMPLANT
SUT SILK 0 CT 1 30 (SUTURE) ×2 IMPLANT
SUT SILK 2 0 (SUTURE) ×2
SUT SILK 2-0 30XBRD TIE 12 (SUTURE) ×2 IMPLANT
SUT SILK 3 0 (SUTURE) ×1
SUT SILK 3 0 SH 30 (SUTURE) IMPLANT
SUT SILK 3-0 (SUTURE) ×2 IMPLANT
SUT SILK 3-0 18XBRD TIE 12 (SUTURE) ×1 IMPLANT
SUT VIC AB 3-0 SH 27 (SUTURE) ×3
SUT VIC AB 3-0 SH 27X BRD (SUTURE) ×3 IMPLANT
SYR 20ML LL LF (SYRINGE) ×2 IMPLANT
SYR 30ML LL (SYRINGE) ×4 IMPLANT
SYR BULB IRRIG 60ML STRL (SYRINGE) ×2 IMPLANT
TRAY FOLEY MTR SLVR 16FR STAT (SET/KITS/TRAYS/PACK) ×2 IMPLANT

## 2020-01-24 NOTE — Anesthesia Preprocedure Evaluation (Signed)
Anesthesia Evaluation  Patient identified by MRN, date of birth, ID band Patient awake    Reviewed: Allergy & Precautions, NPO status , Patient's Chart, lab work & pertinent test results  History of Anesthesia Complications Negative for: history of anesthetic complications  Airway Mallampati: III  TM Distance: >3 FB Neck ROM: Full   Comment: Orthodontic braces in place Dental no notable dental hx. (+) Teeth Intact, Dental Advisory Given   Pulmonary neg pulmonary ROS, neg sleep apnea, neg COPD, Patient abstained from smoking.Not current smoker,    Pulmonary exam normal breath sounds clear to auscultation       Cardiovascular Exercise Tolerance: Good METS(-) hypertension(-) CAD and (-) Past MI negative cardio ROS  (-) dysrhythmias  Rhythm:Regular Rate:Normal - Systolic murmurs    Neuro/Psych  Headaches, PSYCHIATRIC DISORDERS Anxiety Depression    GI/Hepatic GERD  Controlled,(+)     (-) substance abuse  ,   Endo/Other  neg diabetes  Renal/GU negative Renal ROS     Musculoskeletal   Abdominal   Peds  Hematology   Anesthesia Other Findings Past Medical History: No date: Anxiety No date: Complication of anesthesia     Comment:  WOKE UP DURING WISDOM TOOTH SURGERY No date: Depression No date: Deviated septum No date: GERD (gastroesophageal reflux disease) No date: Headache  Reproductive/Obstetrics                             Anesthesia Physical Anesthesia Plan  ASA: II  Anesthesia Plan: General   Post-op Pain Management:    Induction: Intravenous  PONV Risk Score and Plan: 4 or greater and Ondansetron, Dexamethasone, Midazolam and Diphenhydramine  Airway Management Planned: Oral ETT  Additional Equipment: None and Arterial line  Intra-op Plan:   Post-operative Plan: Extubation in OR  Informed Consent: I have reviewed the patients History and Physical, chart, labs and  discussed the procedure including the risks, benefits and alternatives for the proposed anesthesia with the patient or authorized representative who has indicated his/her understanding and acceptance.     Dental advisory given  Plan Discussed with: CRNA and Surgeon  Anesthesia Plan Comments: (Discussed risks of anesthesia with patient, including PONV, sore throat, lip/dental damage, blood loss requiring blood transfusion. Rare risks discussed as well, such as cardiorespiratory and neurological sequelae. Patient understands.  Per endocrinology workup, adrenal mass is not hypersecretory, low risk for pheo, likely benign large mass. Discussed possiblity of A-line, patient understands.)        Anesthesia Quick Evaluation

## 2020-01-24 NOTE — Transfer of Care (Signed)
Immediate Anesthesia Transfer of Care Note  Patient: Terri Cochran  Procedure(s) Performed: Open Left ADRENALECTOMY (Left Abdomen)  Patient Location: PACU  Anesthesia Type:General  Level of Consciousness: drowsy  Airway & Oxygen Therapy: Patient Spontanous Breathing and Patient connected to face mask oxygen  Post-op Assessment: Report given to RN and Post -op Vital signs reviewed and stable  Post vital signs: Reviewed and stable  Last Vitals:  Vitals Value Taken Time  BP 108/67 01/24/20 1117  Temp    Pulse 108 01/24/20 1120  Resp 22 01/24/20 1120  SpO2 99 % 01/24/20 1120  Vitals shown include unvalidated device data.  Last Pain:  Vitals:   01/24/20 1117  TempSrc:   PainSc: (P) Asleep         Complications: No apparent anesthesia complications

## 2020-01-24 NOTE — Progress Notes (Signed)
Contacted Dr. Celine Ahr as patient unable to void. Bladder scan reveals >446ml. Orders from Dr. Celine Ahr to place foley.

## 2020-01-24 NOTE — Interval H&P Note (Signed)
History and Physical Interval Note:  01/24/2020 7:18 AM  Terri Cochran  has presented today for surgery, with the diagnosis of L adrenal myelolipoma.  The various methods of treatment have been discussed with the patient and family. After consideration of risks, benefits and other options for treatment, the patient has consented to  Procedure(s): Open Left ADRENALECTOMY (Left) as a surgical intervention.  The patient's history has been reviewed, patient examined, no change in status, stable for surgery.  I have reviewed the patient's chart and labs.  Questions were answered to the patient's satisfaction.     Fredirick Maudlin

## 2020-01-24 NOTE — Anesthesia Procedure Notes (Signed)
Procedure Name: Intubation Date/Time: 01/24/2020 7:33 AM Performed by: Jerrye Noble, CRNA Pre-anesthesia Checklist: Patient identified, Emergency Drugs available, Suction available and Patient being monitored Patient Re-evaluated:Patient Re-evaluated prior to induction Oxygen Delivery Method: Circle system utilized Preoxygenation: Pre-oxygenation with 100% oxygen Induction Type: IV induction Ventilation: Two handed mask ventilation required and Oral airway inserted - appropriate to patient size Laryngoscope Size: McGraph and 3 Grade View: Grade I Tube type: Oral Tube size: 7.0 mm Number of attempts: 1 Airway Equipment and Method: Stylet and Video-laryngoscopy Placement Confirmation: ETT inserted through vocal cords under direct vision,  positive ETCO2 and breath sounds checked- equal and bilateral Secured at: 22 cm Tube secured with: Tape Dental Injury: Teeth and Oropharynx as per pre-operative assessment

## 2020-01-24 NOTE — Anesthesia Postprocedure Evaluation (Signed)
Anesthesia Post Note  Patient: Terri Cochran  Procedure(s) Performed: Open Left ADRENALECTOMY (Left Abdomen)  Patient location during evaluation: PACU Anesthesia Type: General Level of consciousness: awake and alert Pain management: pain level controlled Vital Signs Assessment: post-procedure vital signs reviewed and stable Respiratory status: spontaneous breathing, nonlabored ventilation, respiratory function stable and patient connected to nasal cannula oxygen Cardiovascular status: blood pressure returned to baseline and stable Postop Assessment: no apparent nausea or vomiting Anesthetic complications: no     Last Vitals:  Vitals:   01/24/20 1400 01/24/20 1402  BP:  112/76  Pulse: 93 95  Resp: 18 19  Temp:    SpO2: 97% 98%    Last Pain:  Vitals:   01/24/20 1408  TempSrc:   PainSc: 0-No pain                 Martha Clan

## 2020-01-24 NOTE — Op Note (Signed)
Operative Note  Preoperative Diagnosis: Left adrenal myelolipoma  Postoperative Diagnosis: Same  Operation: Open left adrenalectomy  Surgeon: Fredirick Maudlin, MD  Assistant: Ronny Bacon, MD (a second surgeon was necessary for exposure and technical expertise)  Anesthesia: General endotracheal  Findings: There was a well-circumscribed fatty tumor consistent with the preoperative diagnosis.  Normal vascular anatomy.  The normal adrenal gland was identified on the anterior aspect of the tumor.  Indications: Terri Cochran is a 29 year old woman who was found to have a large left adrenal tumor.  Imaging characteristics were most consistent with an adrenal myelolipoma; biochemical evaluation confirmed lack of hypersecretion.  Due to the large size (greater than 12 cm) and the mass-effect on the adjacent organs.  She was referred for surgical evaluation.  Open left adrenalectomy was recommended.  The risks of the operation were discussed with the patient and she agreed to proceed.  Procedure In Detail: The patient was identified in the preoperative holding area where she was marked.  She was then brought to the operating room and placed supine on the OR table.  All bony prominences were padded and bilateral sequential compression devices were placed on the lower extremities.  General endotracheal anesthesia was induced without incident.  A Foley catheter was aseptically placed by the nursing staff.  The patient was positioned appropriately for the operation.  She was sterilely prepped and draped in standard fashion.  Perioperative antibiotics were administered.  A timeout was performed confirming the patient's identity, the procedure being performed, her allergies, all necessary equipment was available, and that maintenance anesthesia was adequate.  A generous Makuuchi incision was made with a left-sided extension.  This was carried down through the subcutaneum and muscle layers to the fascia which  was opened.  The peritoneum was then entered.  No adhesions were identified.  The Thompson retractor was set up.  A suture was placed in the corner of the skin flap and used to retract the tissue cranially.  A moist laparotomy sponge was placed over the exposed tissue to prevent desiccation.  We then placed our retractors to provide good exposure.  The mass was palpable through the retroperitoneum.  We began by mobilizing the splenic flexure of the colon reflecting it medially.  The lienorenal ligament and triangular ligament of the spleen were divided allowing these tissues to fall away from the operative bed.  We developed a plane between the tail of the pancreas and the cranial aspect of the tumor and carried this down to the retroperitoneum.  We identified the inferior phrenic vein and divided it between clips.  We then came down over the pole of the kidney and freed the tumor off of the kidney.  We dissected down towards the renal hilum.  As we gently teased the tissue away from the renal vein, we identified the adrenal vein.  There was a very early branch off of the vein which we divided between clips.  We then divided the main adrenal vein with additional ligaclips and the Metzenbaum scissors.  We continued our dissection of the tumor off the retroperitoneum.  We identified the adrenal artery and divided it between clips.  The remaining retroperitoneal attachments were divided and the tumor was completely excised.  Normal adrenal tissue was appreciated on the anterior aspect of the mass.  The entire mass was handed off as a specimen.  We then irrigated the wound bed thoroughly and achieved good hemostasis.  We closed the fascial layer with #1 PDS using small bites technique.  The subcutaneum was irrigated and the deep dermis reapproximated with 3-0 Vicryl.  The skin was closed with staples.  A sterile dressing was applied.  The patient was awakened, extubated, and her Foley catheter removed.  She was taken  to the postanesthesia care unit in good condition.  EBL: 50 cc  IVF: 2.2 L of crystalloid  Specimen(s): Left adrenal myelolipoma to pathology for permanent evaluation  Complications: none immediately apparent.   Counts: all needles, instruments, and sponges were counted and reported to be correct in number at the end of the case.   I was present for and participated in the entire operation.  Fredirick Maudlin 11:21 AM

## 2020-01-25 LAB — COMPREHENSIVE METABOLIC PANEL
ALT: 21 U/L (ref 0–44)
AST: 34 U/L (ref 15–41)
Albumin: 3.1 g/dL — ABNORMAL LOW (ref 3.5–5.0)
Alkaline Phosphatase: 44 U/L (ref 38–126)
Anion gap: 6 (ref 5–15)
BUN: 12 mg/dL (ref 6–20)
CO2: 26 mmol/L (ref 22–32)
Calcium: 8.1 mg/dL — ABNORMAL LOW (ref 8.9–10.3)
Chloride: 106 mmol/L (ref 98–111)
Creatinine, Ser: 1.11 mg/dL — ABNORMAL HIGH (ref 0.44–1.00)
GFR calc Af Amer: >60 mL/min (ref >60)
GFR calc non Af Amer: >60 mL/min (ref >60)
Glucose, Bld: 144 mg/dL — ABNORMAL HIGH (ref 70–99)
Potassium: 3.7 mmol/L (ref 3.5–5.1)
Sodium: 138 mmol/L (ref 135–145)
Total Bilirubin: 0.7 mg/dL (ref 0.3–1.2)
Total Protein: 6.3 g/dL — ABNORMAL LOW (ref 6.5–8.1)

## 2020-01-25 LAB — CBC
HCT: 34.2 % — ABNORMAL LOW (ref 36.0–46.0)
Hemoglobin: 11.1 g/dL — ABNORMAL LOW (ref 12.0–15.0)
MCH: 26 pg (ref 26.0–34.0)
MCHC: 32.5 g/dL (ref 30.0–36.0)
MCV: 80.1 fL (ref 80.0–100.0)
Platelets: 312 10*3/uL (ref 150–400)
RBC: 4.27 MIL/uL (ref 3.87–5.11)
RDW: 14.5 % (ref 11.5–15.5)
WBC: 8.4 10*3/uL (ref 4.0–10.5)
nRBC: 0 % (ref 0.0–0.2)

## 2020-01-25 LAB — MAGNESIUM: Magnesium: 1.8 mg/dL (ref 1.7–2.4)

## 2020-01-25 LAB — PHOSPHORUS: Phosphorus: 3.9 mg/dL (ref 2.5–4.6)

## 2020-01-25 MED ORDER — KETOROLAC TROMETHAMINE 30 MG/ML IJ SOLN
30.0000 mg | Freq: Four times a day (QID) | INTRAMUSCULAR | Status: DC
  Administered 2020-01-25 – 2020-01-26 (×4): 30 mg via INTRAVENOUS
  Filled 2020-01-25 (×4): qty 1

## 2020-01-25 MED ORDER — CHLORHEXIDINE GLUCONATE CLOTH 2 % EX PADS: 6.0000 | MEDICATED_PAD | Freq: Every day | CUTANEOUS | Status: DC

## 2020-01-25 MED ORDER — ACETAMINOPHEN 325 MG PO TABS
975.0000 mg | ORAL_TABLET | Freq: Four times a day (QID) | ORAL | Status: DC
  Administered 2020-01-25 – 2020-01-28 (×11): 975 mg via ORAL
  Filled 2020-01-25 (×13): qty 3

## 2020-01-25 MED ORDER — GABAPENTIN 300 MG PO CAPS
300.0000 mg | ORAL_CAPSULE | Freq: Three times a day (TID) | ORAL | Status: DC
  Administered 2020-01-25 – 2020-01-27 (×8): 300 mg via ORAL
  Filled 2020-01-25 (×8): qty 1

## 2020-01-25 MED ORDER — MAGNESIUM SULFATE 2 GM/50ML IV SOLN
2.0000 g | Freq: Once | INTRAVENOUS | Status: AC
  Administered 2020-01-25: 2 g via INTRAVENOUS
  Filled 2020-01-25: qty 50

## 2020-01-25 MED ORDER — POTASSIUM CHLORIDE CRYS ER 20 MEQ PO TBCR
40.0000 meq | EXTENDED_RELEASE_TABLET | Freq: Once | ORAL | Status: AC
  Administered 2020-01-25: 40 meq via ORAL
  Filled 2020-01-25: qty 2

## 2020-01-25 NOTE — Progress Notes (Signed)
Patient has not been very motivated to ambulate. We were able to get her to sit up in the chair and take a few steps. IS encouraged

## 2020-01-25 NOTE — Progress Notes (Signed)
01/25/2020  Subjective: Patient is 1 Day Post-Op s/p open left adrenalectomy.  Patient unable to void last night and foley catheter was inserted.  Today, she reports she has a lot of incisional pain.  It is difficult to cough because of the pain.  She's trying to avoid narcotic pain medication.  She feels distended and has not had flatus yet.  Vital signs: Temp:  [97 F (36.1 C)-99.3 F (37.4 C)] 98.5 F (36.9 C) (03/06 1233) Pulse Rate:  [89-118] 101 (03/06 1233) Resp:  [10-24] 15 (03/06 1233) BP: (109-126)/(58-90) 115/58 (03/06 1233) SpO2:  [95 %-99 %] 96 % (03/06 1233)   Intake/Output: 03/05 0701 - 03/06 0700 In: 4425.8 [P.O.:410; I.V.:3915.8; IV Piggyback:100] Out: [id number] [Urine:3390; Blood:75] Last BM Date: 01/24/20  Physical Exam: Constitutional: No acute distress Abdomen:  Soft, mildly distended, tender to palpation over incisions.  Incisions with dressings clean, dry, intact.    Labs:  Recent Labs    01/24/20 1856 01/25/20 0431  WBC 12.0* 8.4  HGB 11.7* 11.1*  HCT 36.3 34.2*  PLT 313 312   Recent Labs    01/24/20 1856 01/25/20 0431  NA  --  138  K  --  3.7  CL  --  106  CO2  --  26  GLUCOSE  --  144*  BUN  --  12  CREATININE 1.10* 1.11*  CALCIUM  --  8.1*   No results for input(s): LABPROT, INR in the last 72 hours.  Imaging: No results found.  Assessment/Plan: This is a 29 y.o. female s/p open left adrenalectomy  --Changed patient's pain medication by adding toradol IV scheduled instead of prn.  Also increased Neurontin to TID from BID.   --Encouraged to be OOB and try to ambulate --Continue clear liquid diet --Continue foley today.   Melvyn Neth, [facility]

## 2020-01-26 LAB — BASIC METABOLIC PANEL
Anion gap: 8 (ref 5–15)
BUN: 7 mg/dL (ref 6–20)
CO2: 27 mmol/L (ref 22–32)
Calcium: 8.2 mg/dL — ABNORMAL LOW (ref 8.9–10.3)
Chloride: 101 mmol/L (ref 98–111)
Creatinine, Ser: 0.99 mg/dL (ref 0.44–1.00)
GFR calc Af Amer: >60 mL/min (ref >60)
GFR calc non Af Amer: >60 mL/min (ref >60)
Glucose, Bld: 110 mg/dL — ABNORMAL HIGH (ref 70–99)
Potassium: 3.8 mmol/L (ref 3.5–5.1)
Sodium: 136 mmol/L (ref 135–145)

## 2020-01-26 LAB — MAGNESIUM: Magnesium: 2 mg/dL (ref 1.7–2.4)

## 2020-01-26 MED ORDER — HYDROCODONE-ACETAMINOPHEN 5-325 MG PO TABS
1.0000 | ORAL_TABLET | ORAL | Status: DC | PRN
  Administered 2020-01-27: 1 via ORAL
  Filled 2020-01-26: qty 1

## 2020-01-26 MED ORDER — IBUPROFEN 400 MG PO TABS
600.0000 mg | ORAL_TABLET | Freq: Three times a day (TID) | ORAL | Status: DC | PRN
  Administered 2020-01-26 – 2020-01-27 (×2): 600 mg via ORAL
  Filled 2020-01-26 (×2): qty 2

## 2020-01-26 MED ORDER — MENTHOL 3 MG MT LOZG
1.0000 | LOZENGE | OROMUCOSAL | Status: DC | PRN
  Filled 2020-01-26: qty 9

## 2020-01-26 NOTE — Progress Notes (Signed)
Patient is slightly more motivated today compared to yesterday. She will walk around in the room and sits up in the chair. She is ambulating to the BR

## 2020-01-26 NOTE — Progress Notes (Signed)
01/26/2020  Subjective: Patient is 2 Days Post-Op status post open left adrenalectomy. No acute events overnight. Patient reports that today her pain is better and she started having flatus. She did have a low-grade temp of 100.  Vital signs: Temp:  [98.5 F (36.9 C)-100 F (37.8 C)] 99.3 F (37.4 C) (03/07 1125) Pulse Rate:  [101-129] 109 (03/07 1125) Resp:  [15-24] 16 (03/07 1125) BP: (107-124)/(56-83) 124/83 (03/07 1125) SpO2:  [92 %-96 %] 96 % (03/07 1125)   Intake/Output: 03/06 0701 - 03/07 0700 In: 600 [P.O.:600] Out: 2075 [Urine:2075] Last BM Date: 01/24/20  Physical Exam: Constitutional: No acute distress Abdomen: Softer, obese, less distended, with appropriate tenderness to palpation over the incisions. Incisions are covered with dressings which are clean and dry. GU: Foley catheter in place with clear yellow urine.  Labs:  Recent Labs    01/24/20 1856 01/25/20 0431  WBC 12.0* 8.4  HGB 11.7* 11.1*  HCT 36.3 34.2*  PLT 313 312   Recent Labs    01/25/20 0431 01/26/20 0517  NA 138 136  K 3.7 3.8  CL 106 101  CO2 26 27  GLUCOSE 144* 110*  BUN 12 7  CREATININE 1.11* 0.99  CALCIUM 8.1* 8.2*   No results for input(s): LABPROT, INR in the last 72 hours.  Imaging: No results found.  Assessment/Plan: This is a 29 y.o. female s/p left open adrenalectomy.  -Patient is currently improving with normalized white blood cell count, normalized creatinine, and improving pain. -We will advance to a full liquid diet today. DC IV fluids. -We will DC Foley catheter today. -Encouraged her to continue out of bed and ambulate more. Encouraged her also to use incentive spirometer. This is likely the reason for her low-grade temperature as she has not been able to take deep breaths due to the pain.   Melvyn Neth, [facility]

## 2020-01-26 NOTE — Progress Notes (Signed)
IV no longer working. Patient started upset about having a new IV and verbalized that she did not want to be stuck. She also had an episode where she felt like she was burning internally. Dr Hampton Abbot notified. Order given to leave the IV out. Ibuprofen ordered. Oxycodone changed to norco because the norco was causing the patient to be too drowsy

## 2020-01-27 MED ORDER — ALPRAZOLAM 0.25 MG PO TABS
0.2500 mg | ORAL_TABLET | Freq: Two times a day (BID) | ORAL | Status: DC | PRN
  Administered 2020-01-27: 0.25 mg via ORAL
  Filled 2020-01-27: qty 1

## 2020-01-27 NOTE — TOC Initial Note (Signed)
Transition of Care (TOC) - Initial/Assessment Note    Patient Details  Name: Terri Cochran MRN: [id number] Date of Birth: 10/12/1991  Transition of Care (TOC) CM/SW Contact:    Sarah C Boswell, LCSW Phone Number: 01/27/2020, 12:17 PM  Clinical Narrative: CSW met with patient. No supports at bedside. CSW introduced role and inquired about interest in outpatient counseling resources. Patient agreeable. She confirmed she lives in [place], [place]. Provided treatment centers within 25 miles of her address. Patient confirmed she will likely go home tomorrow and said she should have a ride. No further concerns. CSW encouraged patient to contact CSW as needed. CSW will continue to follow patient for support and facilitate return home when stable.                 Expected Discharge Plan: Home/Self Care Barriers to Discharge: Continued Medical Work up   Patient Goals and CMS Choice        Expected Discharge Plan and Services Expected Discharge Plan: Home/Self Care     Post Acute Care Choice: NA Living arrangements for the past 2 months: Single Family Home                                      Prior Living Arrangements/Services Living arrangements for the past 2 months: Single Family Home   Patient language and need for interpreter reviewed:: Yes Do you feel safe going back to the place where you live?: Yes      Need for Family Participation in Patient Care: Yes (Comment)     Criminal Activity/Legal Involvement Pertinent to Current Situation/Hospitalization: No - Comment as needed  Activities of Daily Living Home Assistive Devices/Equipment: Eyeglasses ADL Screening (condition at time of admission) Patient's cognitive ability adequate to safely complete daily activities?: Yes Is the patient deaf or have difficulty hearing?: No Does the patient have difficulty seeing, even when wearing glasses/contacts?: No Does the patient have difficulty concentrating, remembering, or  making decisions?: No Patient able to express need for assistance with ADLs?: Yes Does the patient have difficulty dressing or bathing?: No Independently performs ADLs?: Yes (appropriate for developmental age) Does the patient have difficulty walking or climbing stairs?: No Weakness of Legs: None Weakness of Arms/Hands: None  Permission Sought/Granted                  Emotional Assessment Appearance:: Appears stated age Attitude/Demeanor/Rapport: Engaged, Gracious Affect (typically observed): Accepting, Calm, Pleasant Orientation: : Oriented to Self, Oriented to Place, Oriented to  Time, Oriented to Situation Alcohol / Substance Use: Not Applicable Psych Involvement: No (comment)  Admission diagnosis:  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 Change in stool 11/29/2019  . Fatty liver disease, nonalcoholic 11/25/2019  . Vaginal discharge 11/11/2019  . Lab test positive for detection of COVID-19 virus 11/11/2019  . GERD (gastroesophageal reflux disease) 11/11/2019  . Left adrenal mass (HCC) 11/11/2019  . Adjustment reaction with anxiety and depression 11/01/2019  . Low back pain 11/01/2019  . Encounter for preconception consultation 11/01/2019   PCP:  Arnett, Margaret G, FNP Pharmacy:   [id number] - [place], [place] - [address] [place] [address] Phone: [phone] Fax: [phone]     Social   Determinants of Health (SDOH) Interventions    Readmission Risk Interventions No flowsheet data found.  

## 2020-01-27 NOTE — Progress Notes (Addendum)
[facility] Hospital Day(s): 3.   Post op day(s): 3 Days Post-Op.   Interval History:  Patient seen and examined no acute events or new complaints overnight.  Patient reports she is feeling better, pain at incisions, reasonably controlled with pain medications No nausea or emesis, though she did report diarrhea this morning No fevers Intermittent tachycardia, blood pressure stable No new labs Tolerating PO (full liquids) and having bowel function   Vital signs in last 24 hours: [min-max] current  Temp:  [97.9 F (36.6 C)-100 F (37.8 C)] 97.9 F (36.6 C) (03/08 0308) Pulse Rate:  [100-114] 100 (03/08 0308) Resp:  [16-20] 16 (03/08 0308) BP: (113-124)/(78-87) 113/78 (03/08 0308) SpO2:  [92 %-96 %] 92 % (03/08 0308)     Height: 5\' 6"  (167.6 cm) Weight: 104.3 kg BMI (Calculated): 37.14   Intake/Output last 2 shifts:  03/07 0701 - 03/08 0700 In: 0  Out: 1075 [Urine:1075]    Physical Exam:  Constitutional: alert, cooperative and no distress  Respiratory: breathing non-labored at rest  Cardiovascular: regular rate and sinus rhythm  Gastrointestinal: soft, incisional soreness, and non-distended, no rebound/guarding Integumentary: Makuuchi incision is CDI with staples, no erythema or drainage    Labs:  CBC Latest Ref Rng & Units 01/25/2020 01/24/2020 11/04/2019  WBC 4.0 - 10.5 K/uL 8.4 12.0(H) 4.3  Hemoglobin 12.0 - 15.0 g/dL 11.1(L) 11.7(L) 12.2  Hematocrit 36.0 - 46.0 % 34.2(L) 36.3 38.0  Platelets 150 - 400 K/uL 312 313 263   CMP Latest Ref Rng & Units 01/26/2020 01/25/2020 01/24/2020  Glucose 70 - 99 mg/dL 110(H) 144(H) -  BUN 6 - 20 mg/dL 7 12 -  Creatinine 0.44 - 1.00 mg/dL 0.99 1.11(H) 1.10(H)  Sodium 135 - 145 mmol/L 136 138 -  Potassium 3.5 - 5.1 mmol/L 3.8 3.7 -  Chloride 98 - 111 mmol/L 101 106 -  CO2 22 - 32 mmol/L 27 26 -  Calcium 8.9 - 10.3 mg/dL 8.2(L) 8.1(L) -  Total Protein 6.5 - 8.1 g/dL - 6.3(L) -  Total Bilirubin  0.3 - 1.2 mg/dL - 0.7 -  Alkaline Phos 38 - 126 U/L - 44 -  AST 15 - 41 U/L - 34 -  ALT 0 - 44 U/L - 21 -    Imaging studies: No new pertinent imaging studies   Assessment/Plan:  29 y.o. female doing well 3 Days Post-Op s/p open left adrenal ectomy for left adrenal myelolipoma causing mass effect   - Advance to soft diet  - Again explained the importance of IV access however she is refusing   - Pain control prn   - monitor abdominal examination; on-going bowel fucntion   - mobilization encouraged  - medical management of comorbid conditions  - periods of tachycardia may be secondary to pain; no concern for cardiac issue  - Discharge Planning Hopefully home in the AM   All of the above findings and recommendations were discussed with the patient, and the medical team, and all of patient's questions were answered to her expressed satisfaction.  -- Terri Simon, PA-C [facility] 01/27/2020, 9:20 AM [phone] M-F: 7am - 4pm  I saw and evaluated the patient.  I agree with the above documentation, exam, and plan, which I have edited where appropriate. Terri Cochran  10:57 AM

## 2020-01-28 MED ORDER — OXYCODONE HCL 5 MG PO TABS: 5.0000 mg | ORAL_TABLET | Freq: Four times a day (QID) | ORAL | 0 refills | Status: DC | PRN

## 2020-01-28 MED ORDER — ACETAMINOPHEN 325 MG PO TABS: 650.0000 mg | ORAL_TABLET | ORAL | 1 refills | Status: DC | PRN

## 2020-01-28 MED ORDER — IBUPROFEN 600 MG PO TABS: 600.0000 mg | ORAL_TABLET | Freq: Three times a day (TID) | ORAL | 0 refills | Status: DC | PRN

## 2020-01-28 NOTE — TOC Transition Note (Signed)
Transition of Care [facility]) - CM/SW Discharge Note   Patient Details  Name: Breana Harville MRN: [id number] Date of Birth: 04/11/1991  Transition of Care [facility]) CM/SW Contact:  Candie Chroman, LCSW Phone Number: 01/28/2020, 4:19 PM   Clinical Narrative: Patient has orders to discharge home today. No further concerns. CSW signing off.    Final next level of care: Home/Self Care Barriers to Discharge: Barriers Resolved   Patient Goals and CMS Choice        Discharge Placement                    Patient and family notified of of transfer: 01/28/20  Discharge Plan and Services     Post Acute Care Choice: NA                               Social Determinants of Health (SDOH) Interventions     Readmission Risk Interventions No flowsheet data found.

## 2020-01-28 NOTE — Plan of Care (Signed)
The patient has been stable. Discharged instructions provide and education was done.  Problem: Clinical Measurements: Goal: Will remain free from infection Outcome: Completed/Met   Problem: Activity: Goal: Risk for activity intolerance will decrease Outcome: Completed/Met   Problem: Skin Integrity: Goal: Risk for impaired skin integrity will decrease Outcome: Completed/Met   Problem: Education: Goal: Knowledge of General Education information will improve Description: Including pain rating scale, medication(s)/side effects and non-pharmacologic comfort measures Outcome: Completed/Met   Problem: Clinical Measurements: Goal: Ability to maintain clinical measurements within normal limits will improve Outcome: Completed/Met Goal: Will remain free from infection Outcome: Completed/Met Goal: Diagnostic test results will improve Outcome: Completed/Met Goal: Respiratory complications will improve Outcome: Completed/Met Goal: Cardiovascular complication will be avoided Outcome: Completed/Met   Problem: Activity: Goal: Risk for activity intolerance will decrease Outcome: Completed/Met   Problem: Nutrition: Goal: Adequate nutrition will be maintained Outcome: Completed/Met   Problem: Coping: Goal: Level of anxiety will decrease Outcome: Completed/Met   Problem: Pain Managment: Goal: General experience of comfort will improve Outcome: Completed/Met   Problem: Safety: Goal: Ability to remain free from injury will improve Outcome: Completed/Met   Problem: Elimination: Goal: Will not experience complications related to bowel motility Outcome: Completed/Met Goal: Will not experience complications related to urinary retention Outcome: Completed/Met

## 2020-01-28 NOTE — Discharge Summary (Addendum)
[facility] SURGICAL DISCHARGE SUMMARY  Patient ID: Terri Cochran MRN: [id number] DOB/AGE: 17-Feb-1991 29 y.o.  Admit date: 01/24/2020 Discharge date: 01/28/2020  Discharge Diagnoses Patient Active Problem List   Diagnosis Date Noted   Benign mass of left adrenal gland ([place]) 01/24/2020   Left adrenal mass ([place]) 11/11/2019    Consultants None  Procedures 01/24/2020:  Open left adrenalectomy  HPI: Terri Cochran is a 29 year old woman who was found to have a large left adrenal tumor.  Imaging characteristics were most consistent with an adrenal myelolipoma; biochemical evaluation confirmed lack of hypersecretion.  Due to the large size (greater than 12 cm) and the mass-effect on the adjacent organs.  She was referred for surgical evaluation.  Open left adrenalectomy was recommended.  The risks of the operation were discussed with the patient and she agreed to proceed.  Hospital Course: Informed consent was obtained and documented, and patient underwent uneventful open left adrenalectomy (Dr Celine Ahr, 01/24/2020).  Post-operatively, patient's biggest issue was pain control which gradually improved. Advancement of patient's diet and ambulation were well-tolerated. The remainder of patient's hospital course was essentially unremarkable, and discharge planning was initiated accordingly with patient safely able to be discharged home with appropriate discharge instructions, pain control, and outpatient follow-up after all of her questions were answered to her expressed satisfaction.   Discharge Condition: Good   Physical Examination:  Constitutional: alert, cooperative and no distress  Respiratory: breathing non-labored at rest  Cardiovascular: regular rate and sinus rhythm  Gastrointestinal: soft, incisional soreness, and non-distended, no rebound/guarding Integumentary: Makuuchi incision is CDI with staples, no erythema or drainage   Allergies as of 01/28/2020   No Known  Allergies      Medication List     TAKE these medications    acetaminophen 325 MG tablet Commonly known as: TYLENOL Take 2 tablets (650 mg total) by mouth every 4 (four) hours as needed for mild pain or moderate pain.   calcium carbonate 500 MG chewable tablet Commonly known as: TUMS - dosed in mg elemental calcium Chew 1 tablet by mouth 2 (two) times daily as needed for indigestion or heartburn.   diphenhydramine-acetaminophen 25-500 MG Tabs tablet Commonly known as: TYLENOL PM Take 2 tablets by mouth at bedtime as needed (sleep).   ibuprofen 600 MG tablet Commonly known as: ADVIL Take 1 tablet (600 mg total) by mouth every 8 (eight) hours as needed for mild pain. What changed:  medication strength how much to take when to take this reasons to take this   Melatonin 10 MG Tabs Take 10 mg by mouth at bedtime as needed (sleep).   oxyCODONE 5 MG immediate release tablet Commonly known as: Oxy IR/ROXICODONE Take 1 tablet (5 mg total) by mouth every 6 (six) hours as needed for severe pain or breakthrough pain.   prenatal vitamin w/FE, FA 27-1 MG Tabs tablet Take 1 tablet by mouth daily at 12 noon.         Follow-up Information     Fredirick Maudlin, MD. Schedule an appointment as soon as possible for a visit in 1 week(s).   Specialty: General Surgery Why: s/p open left adrenalectomy Contact information: [address] [place] [address] [phone]             Time spent on discharge management including discussion of hospital course, clinical condition, outpatient instructions, prescriptions, and follow up with the patient and members of the medical team: >30 minutes  -- [name] , PA-C [facility]  01/28/2020, 4:06 PM [phone] M-F: 7am - 4pm  I saw and evaluated the patient.  I agree with the above documentation, exam, and plan, which I have edited where appropriate. Fredirick Maudlin  5:39 PM

## 2020-01-29 LAB — SURGICAL PATHOLOGY

## 2020-01-30 ENCOUNTER — Encounter: Payer: Self-pay | Admitting: General Surgery

## 2020-01-30 ENCOUNTER — Telehealth: Payer: Self-pay

## 2020-01-30 ENCOUNTER — Encounter: Payer: Self-pay | Admitting: Family

## 2020-01-30 NOTE — Telephone Encounter (Signed)
I called patient back in regards to [employer]. She asked that I send her information to [facility] as well as some counselors through [employer]. I have sent these to her. She said that she couldn't really walk in right now because she is still recovering from her surgery. I sent her numbers to counselors, so maybe that she could at least talk to someone then. Patient stated that at this time she did not hare any active thoughts of hurting herself.   Her main concern was sleeping & suggested the unisom sleep gels & pt stated she has taken those. She also used to take tylenol PM for years, but it hasn't worked. Patient would like to discuss with you Tuesday.

## 2020-01-31 NOTE — Telephone Encounter (Signed)
Noted Yes we can discuss all on tuesday

## 2020-02-04 ENCOUNTER — Telehealth: Payer: Self-pay

## 2020-02-04 ENCOUNTER — Encounter: Payer: Self-pay | Admitting: Family

## 2020-02-04 ENCOUNTER — Telehealth: Payer: Self-pay | Admitting: Family

## 2020-02-04 ENCOUNTER — Encounter: Payer: Self-pay | Admitting: General Surgery

## 2020-02-04 ENCOUNTER — Ambulatory Visit (INDEPENDENT_AMBULATORY_CARE_PROVIDER_SITE_OTHER): Payer: 59 | Admitting: Family

## 2020-02-04 ENCOUNTER — Other Ambulatory Visit: Payer: Self-pay

## 2020-02-04 ENCOUNTER — Ambulatory Visit (INDEPENDENT_AMBULATORY_CARE_PROVIDER_SITE_OTHER): Payer: Self-pay | Admitting: General Surgery

## 2020-02-04 VITALS — BP 111/79 | HR 99 | Temp 97.2°F | Ht 66.0 in | Wt 225.0 lb

## 2020-02-04 VITALS — BP 118/84 | HR 98 | Temp 97.6°F | Ht 66.0 in | Wt 225.2 lb

## 2020-02-04 DIAGNOSIS — F4323 Adjustment disorder with mixed anxiety and depressed mood: Secondary | ICD-10-CM

## 2020-02-04 DIAGNOSIS — N939 Abnormal uterine and vaginal bleeding, unspecified: Secondary | ICD-10-CM | POA: Diagnosis not present

## 2020-02-04 DIAGNOSIS — R300 Dysuria: Secondary | ICD-10-CM | POA: Diagnosis not present

## 2020-02-04 DIAGNOSIS — D3502 Benign neoplasm of left adrenal gland: Secondary | ICD-10-CM | POA: Insufficient documentation

## 2020-02-04 MED ORDER — SERTRALINE HCL 50 MG PO TABS: 50.0000 mg | ORAL_TABLET | Freq: Every day | ORAL | 3 refills | Status: DC

## 2020-02-04 NOTE — Patient Instructions (Addendum)
Dr.Cannon suggest patient to stay hydrated with drinking plenty of fluids and getting plenty of rest.  Patient advised to try to drink Pedialyte and Gatorade to help with hydration and to try soup(s) (Chicken Noodle) and Saltine Crackers.  Patient was advised she may walk, just no extraneous exercises. Patient is still to refrain from no heavy lifting of more than 10 pounds.  Patient had staples removed and steri stripes. Patient may continue to shower, but no submerging in the bath tub.   Patient was advised to apply Vitamin E oil to help with moisturizing the area.   GENERAL POST-OPERATIVE PATIENT INSTRUCTIONS   FOLLOW-UP:  Please make an appointment with your physician in.  Call your physician immediately if you have any fevers greater than 102.5, drainage from you wound that is not clear or looks infected, persistent bleeding, increasing abdominal pain, problems urinating, or persistent nausea/vomiting.    WOUND CARE INSTRUCTIONS:  Keep a dry clean dressing on the wound if there is drainage. The initial bandage may be removed after 24 hours.  Once the wound has quit draining you may leave it open to air.  If clothing rubs against the wound or causes irritation and the wound is not draining you may cover it with a dry dressing during the daytime.  Try to keep the wound dry and avoid ointments on the wound unless directed to do so.  If the wound becomes bright red and painful or starts to drain infected material that is not clear, please contact your physician immediately.  If the wound is mildly pink and has a thick firm ridge underneath it, this is normal, and is referred to as a healing ridge.  This will resolve over the next 4-6 weeks.  DIET:  You may eat any foods that you can tolerate.  It is a good idea to eat a high fiber diet and take in plenty of fluids to prevent constipation.  If you do become constipated you may want to take a mild laxative or take ducolax tablets on a daily basis until  your bowel habits are regular.  Constipation can be very uncomfortable, along with straining, after recent surgery.  ACTIVITY:  You are encouraged to cough and deep breath or use your incentive spirometer if you were given one, every 15-30 minutes when awake.  This will help prevent respiratory complications and low grade fevers post-operatively if you had a general anesthetic.  You may want to hug a pillow when coughing and sneezing to add additional support to the surgical area, if you had abdominal or chest surgery, which will decrease pain during these times.  You are encouraged to walk and engage in light activity for the next two weeks.  You should not lift more than 20 pounds during this time frame as it could put you at increased risk for complications.  Twenty pounds is roughly equivalent to a plastic bag of groceries.    MEDICATIONS:  Try to take narcotic medications and anti-inflammatory medications, such as tylenol, ibuprofen, naprosyn, etc., with food.  This will minimize stomach upset from the medication.  Should you develop nausea and vomiting from the pain medication, or develop a rash, please discontinue the medication and contact your physician.  You should not drive, make important decisions, or operate machinery when taking narcotic pain medication.  QUESTIONS:  Please feel free to call your physician or the hospital operator if you have any questions, and they will be glad to assist you.

## 2020-02-04 NOTE — Assessment & Plan Note (Signed)
Discussed with patient etiologies for this including a lighter menstrual cycle,irritation from the catheter which she had postoperatively,UTI.  Pending urine studies at this time.  Did advise her that since she did not complete treatment for yeast infection as prescribed by OB/GYN, to complete this therapy.

## 2020-02-04 NOTE — Telephone Encounter (Signed)
Noted  Appreciate the information Patient did not have active suicide thoughts. She didn't have suicide plan.  I do think she needs counseling however she would like to set up herself I will call her tomorrow

## 2020-02-04 NOTE — Patient Instructions (Addendum)
Start zoloft  Dispose of narcotics immediately.   I would complete the yeast infection medication.   I would like for you to go to [place].  Eric from the crisis center will see you today.    The address is [place], [place] [address].  Phone number is [phone].   They are near [facility] and [facility].   Please know that I am thinking about you.   Our hope is for gradual improvement of mood since starting medication; however this may take several weeks.   If you start to have unusual thoughts, thoughts of hurting yourself, or anyone else, please go immediately to the emergency department.   Follow up in one month.    National Suicide Prevention Hotline - available 24 hours a day, 7 days a week.  [phone]  Major Depressive Disorder Major depressive disorder is a mental illness. It also may be called clinical depression or unipolar depression. Major depressive disorder usually causes feelings of sadness, hopelessness, or helplessness. Some people with this disorder do not feel particularly sad but lose interest in doing things they used to enjoy (anhedonia). Major depressive disorder also can cause physical symptoms. It can interfere with work, school, relationships, and other normal everyday activities. The disorder varies in severity but is longer lasting and more serious than the sadness we all feel from time to time in our lives. Major depressive disorder often is triggered by stressful life events or major life changes. Examples of these triggers include divorce, loss of your job or home, a move, and the death of a family member or close friend. Sometimes this disorder occurs for no obvious reason at all. People who have family members with major depressive disorder or bipolar disorder are at higher risk for developing this disorder, with or without life stressors. Major depressive disorder can occur at any  age. It may occur just once in your life (single episode major depressive disorder). It may occur multiple times (recurrent major depressive disorder). SYMPTOMS People with major depressive disorder have either anhedonia or depressed mood on nearly a daily basis for at least 2 weeks or longer. Symptoms of depressed mood include:  Feelings of sadness (blue or down in the dumps) or emptiness.  Feelings of hopelessness or helplessness.  Tearfulness or episodes of crying (may be observed by others).  Irritability (children and adolescents). In addition to depressed mood or anhedonia or both, people with this disorder have at least four of the following symptoms:  Difficulty sleeping or sleeping too much.   Significant change (increase or decrease) in appetite or weight.   Lack of energy or motivation.  Feelings of guilt and worthlessness.   Difficulty concentrating, remembering, or making decisions.  Unusually slow movement (psychomotor retardation) or restlessness (as observed by others).   Recurrent wishes for death, recurrent thoughts of self-harm (suicide), or a suicide attempt. People with major depressive disorder commonly have persistent negative thoughts about themselves, other people, and the world. People with severe major depressive disorder may experiencedistorted beliefs or perceptions about the world (psychotic delusions). They also may see or hear things that are not real (psychotic hallucinations). DIAGNOSIS Major depressive disorder is diagnosed through an assessment by your health care provider. Your health care provider will ask aboutaspects of your daily life, such as mood,sleep, and appetite, to see if you have the diagnostic symptoms of major depressive disorder. Your health care provider may ask about your medical  history and use of alcohol or drugs, including prescription medicines. Your health care provider also may do a physical exam and blood work. This is  because certain medical conditions and the use of certain substances can cause major depressive disorder-like symptoms (secondary depression). Your health care provider also may refer you to a mental health specialist for further evaluation and treatment. TREATMENT It is important to recognize the symptoms of major depressive disorder and seek treatment. The following treatments can be prescribed for this disorder:   Medicine. Antidepressant medicines usually are prescribed. Antidepressant medicines are thought to correct chemical imbalances in the brain that are commonly associated with major depressive disorder. Other types of medicine may be added if the symptoms do not respond to antidepressant medicines alone or if psychotic delusions or hallucinations occur.  Talk therapy. Talk therapy can be helpful in treating major depressive disorder by providing support, education, and guidance. Certain types of talk therapy also can help with negative thinking (cognitive behavioral therapy) and with relationship issues that trigger this disorder (interpersonal therapy). A mental health specialist can help determine which treatment is best for you. Most people with major depressive disorder do well with a combination of medicine and talk therapy. Treatments involving electrical stimulation of the brain can be used in situations with extremely severe symptoms or when medicine and talk therapy do not work over time. These treatments include electroconvulsive therapy, transcranial magnetic stimulation, and vagal nerve stimulation.   This information is not intended to replace advice given to you by your health care provider. Make sure you discuss any questions you have with your health care provider.   Document Released: 03/04/2013 Document Revised: 11/28/2014 Document Reviewed: 03/04/2013 Elsevier Interactive Patient Education [employer].

## 2020-02-04 NOTE — Telephone Encounter (Signed)
After sending patient to [place] her leaving after signing in, but before being seen Ellwood Sayers called back to make [place] aware of this. She stated that they wanted to [place] know patient left before being seen & that we could send police to do a wellness check or go to grand magistrate for IVC papers.   Samantha's # at [place]
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
## 2020-02-04 NOTE — Telephone Encounter (Signed)
Noted  

## 2020-02-04 NOTE — Telephone Encounter (Signed)
Noted I spoke with patient in regards to this

## 2020-02-04 NOTE — Assessment & Plan Note (Addendum)
Worsened.  Had a long discussion with patient in regards to harming herself.  She is adamant that she is very religious and is against her religion to commit suicide. She doesn't appear to be a threat to herself of anyone else at this time. Her thoughts of self arm are passive. She has no suicide plan.  anxiety, depression, insomnia is severely uncontrolled. She feels strongly that she would like to speak with a counselor.  My nurse Judson Roch had called to [facility] and spoke with crisis counselor  Randall Hiss who was able to  see patient this morning after her appointment with me.  Patient drove over there and then called back to the office to say she did not feel comfortable with the location and that she would prefer to call counselors that she knows and to schedule appointment on her own.  She declines a referral to psychiatry at this time.  she declined my further making an appointment to see a counselor. She plans to start the Zoloft.  She will let me know when she made an appointment with a counselor. I also spoke with husband on the phone as he waited for his wife in the car.  We discussed that patient attempted to use narcotics over the weekend.  I have advised him to remove them from the house.  Patient and husband verbalized understanding and are agreement with this.  She has signed a suicide contract and we have discussed that she would speak with her close friend Shelley Martinique if she has any thoughts of hurting herself ; she understands that she willl would also call 911 as she did this past weekend. Close follow up.

## 2020-02-04 NOTE — Progress Notes (Signed)
Terri Cochran is here today for a postoperative visit.  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  She has been doing fairly well since her operation, but does endorse poor oral intake secondary to mild nausea.  She has not thrown up.  She says that she feels somewhat lightheaded each day.  She is only taking ibuprofen for pain control and says that she is having some shoulder and neck pain.  This pain seems to come along when she tries to drink fluids.  From her description, it does not sound like she is staying adequately hydrated.  Final pathology: DIAGNOSIS:  A. 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  - ADJACENT UNREMARKABLE ADRENAL GLAND.  - NEGATIVE FOR MALIGNANCY.  - SEE COMMENT.   Comment:  Sections demonstrate mature adipose tissue, stratified squamous  epithelium with sebaceous glands, hair shafts, calcification, and rare  bony spicules. Immature elements are not identified. The lesion is  adjacent to, but does not involve, the adrenal gland. Retroperitoneal  teratoma is an uncommon benign entity.   Today's Vitals   02/04/20 0927  BP: 111/79  Pulse: 99  Temp: (!) 97.2 F (36.2 C)  TempSrc: Temporal  SpO2: 97%  Weight: 225 lb (102.1 kg)  Height: 5\' 6"  (1.676 m)  PainSc: 4   PainLoc: Abdomen   Body mass index is 36.32 kg/m. Focused abdominal exam: Her Makuuchi incision is healing nicely.  The staples are intact.  There is no surrounding erythema, induration, or any drainage.  We removed her staples today and applied Steri-Strips.  We gave her some suggestions regarding ways that she could optimize her oral intake, including Gatorade or Pedialyte for better hydration, bland soft foods, rather than the fried food she has been attempting to eat.  She may shower and allow the Steri-Strips to  fall off on their own.  She should continue to refrain from lifting anything heavier than 10 pounds for a total of 6 weeks after her operation.  She may ambulate at will and use stairs without restriction.  We have scheduled a follow-up appointment in 2 weeks, however if she is feeling well she may cancel that appointment.

## 2020-02-04 NOTE — Telephone Encounter (Signed)
Samantha from [facility] called and stated patient left without being seen.  Wanted to make PCP aware she stated That patient said I do not not know why I was sent here that I am  better than this place." Facility hung up phone.

## 2020-02-05 ENCOUNTER — Telehealth: Payer: Self-pay | Admitting: Family

## 2020-02-05 NOTE — Telephone Encounter (Signed)
Noted  

## 2020-02-05 NOTE — Telephone Encounter (Signed)
FYI sarah if she calls back  I left voicemail and asked patient how she is doing.  I asked her to give [place] a call back.  Id like to know if she made an appt with counselor

## 2020-02-06 ENCOUNTER — Telehealth: Payer: Self-pay | Admitting: *Deleted

## 2020-02-06 NOTE — Telephone Encounter (Signed)
Called patient and no answer. Message left for the patient to see if she could come into the office today to be seen by Dr Celine Ahr.

## 2020-02-06 NOTE — Telephone Encounter (Signed)
Patient may come in tomorrow to be seen if she does not go to the ER tonight.

## 2020-02-06 NOTE — Telephone Encounter (Signed)
Patient called and is not been feeling well at all she feels like her body is shutting down and is unable to get out of bed. She stated that she has dark yellowish green discharged coming out of her incision site and on to the bandage. She has been experencing chills and hot flashed and nausea. Please call and advise.   No redness or fever

## 2020-02-06 NOTE — Telephone Encounter (Signed)
Patient called back and I asked if she could come into the office today and she is going to call back if she can find a ride. I did advise her if not then she needs to go to the ER

## 2020-02-09 ENCOUNTER — Encounter: Payer: Self-pay | Admitting: Family

## 2020-02-10 NOTE — Telephone Encounter (Signed)
I spoke to patient & advised that she really needed to be seen at ED for concern of sepsis & infection. Patient was agreeable to being evaluated at ED for this. I let patient know that we will call to check back with her.

## 2020-02-18 ENCOUNTER — Encounter: Payer: Self-pay | Admitting: Gastroenterology

## 2020-02-18 ENCOUNTER — Encounter: Payer: 59 | Admitting: General Surgery

## 2020-02-18 ENCOUNTER — Ambulatory Visit (INDEPENDENT_AMBULATORY_CARE_PROVIDER_SITE_OTHER): Payer: 59 | Admitting: Gastroenterology

## 2020-02-18 VITALS — BP 104/72 | HR 88 | Temp 97.7°F | Ht 66.0 in | Wt 223.2 lb

## 2020-02-18 DIAGNOSIS — R1031 Right lower quadrant pain: Secondary | ICD-10-CM

## 2020-02-18 DIAGNOSIS — K625 Hemorrhage of anus and rectum: Secondary | ICD-10-CM

## 2020-02-18 DIAGNOSIS — D489 Neoplasm of uncertain behavior, unspecified: Secondary | ICD-10-CM

## 2020-02-18 DIAGNOSIS — R932 Abnormal findings on diagnostic imaging of liver and biliary tract: Secondary | ICD-10-CM

## 2020-02-18 DIAGNOSIS — R1032 Left lower quadrant pain: Secondary | ICD-10-CM

## 2020-02-18 DIAGNOSIS — G8929 Other chronic pain: Secondary | ICD-10-CM

## 2020-02-18 DIAGNOSIS — K219 Gastro-esophageal reflux disease without esophagitis: Secondary | ICD-10-CM

## 2020-02-18 DIAGNOSIS — K5909 Other constipation: Secondary | ICD-10-CM | POA: Diagnosis not present

## 2020-02-18 DIAGNOSIS — K760 Fatty (change of) liver, not elsewhere classified: Secondary | ICD-10-CM

## 2020-02-18 DIAGNOSIS — R112 Nausea with vomiting, unspecified: Secondary | ICD-10-CM

## 2020-02-18 NOTE — Patient Instructions (Addendum)
Your provider has requested that you go to the basement level for lab work before leaving today. Press "B" on the elevator. The lab is located at the first door on the left as you exit the elevator.  Your provider has requested that you have an abdominal x ray before leaving today. Please go to the basement floor to our Radiology department for the test.  You have been scheduled for a CT scan of the abdomen and pelvis at [facility], 1st floor Radiology.  You are scheduled on 02/28/2020 at 8:30am. You should arrive 15 minutes prior to your appointment time for registration.   Please go to [facility] Radiology at least 3 days prior to your procedure to pick up instructions and contrast for the exam.  WARNING: IF YOU ARE ALLERGIC TO IODINE/X-RAY DYE, PLEASE NOTIFY RADIOLOGY IMMEDIATELY AT [phone]! YOU WILL BE GIVEN A 13 HOUR PREMEDICATION PREP.  Please do not eat or drink anything 4 hours prior to CT ( 4:30am).   If you have any questions regarding your exam or if you need to reschedule, you may call [phone] between the hours of 8:00 am and 5:00 pm, Monday-Friday.  ________________________________________________________________________  Increase Prune Juice to 2 times daily.  Start Dulcolax 10mg  once daily for 1 day, if no bowel movement increase to twice daily. Try a glycerin suppository nightly until you have bowel movement. Once you have bowel movement, then use every other night.  Trial of Milk of Magnesium if no bowel movement in 48hrs- once daily.  If still no bowel movement then start Trial of Linzess 146mcg- once daily. ( samples given).   Thank you for choosing me and [facility].  Dr. Rush Landmark

## 2020-02-19 ENCOUNTER — Encounter: Payer: Self-pay | Admitting: Gastroenterology

## 2020-02-19 ENCOUNTER — Other Ambulatory Visit: Payer: Self-pay

## 2020-02-19 DIAGNOSIS — K625 Hemorrhage of anus and rectum: Secondary | ICD-10-CM | POA: Insufficient documentation

## 2020-02-19 DIAGNOSIS — R932 Abnormal findings on diagnostic imaging of liver and biliary tract: Secondary | ICD-10-CM | POA: Insufficient documentation

## 2020-02-19 DIAGNOSIS — K5909 Other constipation: Secondary | ICD-10-CM | POA: Insufficient documentation

## 2020-02-19 DIAGNOSIS — D489 Neoplasm of uncertain behavior, unspecified: Secondary | ICD-10-CM | POA: Insufficient documentation

## 2020-02-19 DIAGNOSIS — G8929 Other chronic pain: Secondary | ICD-10-CM | POA: Insufficient documentation

## 2020-02-19 DIAGNOSIS — R1031 Right lower quadrant pain: Secondary | ICD-10-CM | POA: Insufficient documentation

## 2020-02-19 DIAGNOSIS — R1110 Vomiting, unspecified: Secondary | ICD-10-CM | POA: Insufficient documentation

## 2020-02-19 MED ORDER — ONDANSETRON 8 MG PO TBDP: 8.0000 mg | ORAL_TABLET | Freq: Three times a day (TID) | ORAL | 1 refills | Status: DC | PRN

## 2020-02-19 MED ORDER — OMEPRAZOLE 40 MG PO CPDR: 40.0000 mg | DELAYED_RELEASE_CAPSULE | Freq: Every day | ORAL | 3 refills | Status: DC

## 2020-02-19 NOTE — Progress Notes (Signed)
This encounter was created in error - please disregard.

## 2020-02-19 NOTE — Progress Notes (Signed)
[place] VISIT   Primary Care Provider Burnard Hawthorne, FNP [address] [place] [address] [phone]  Referring Provider Vidal Schwalbe Yvetta Coder, FNP [address] [place],  [place] [address] [phone]  Patient Profile: Terri Cochran is a 29 y.o. female with a pmh significant for reported chronic GERD, MDD/anxiety, headaches, recent abdominal teratoma excision, chronic constipation,, nephrolithiasis, question fatty liver (based on MRI).  The patient presents to the [facility] for an evaluation and management of problem(s) noted below:  Problem List 1. Chronic bilateral lower abdominal pain   2. History of Teratoma   3. BRBPR (bright red blood per rectum)   4. Chronic constipation   5. Non-intractable vomiting with nausea, unspecified vomiting type   6. Gastroesophageal reflux disease, unspecified whether esophagitis present   7. Fatty liver   8. Abnormal MRI, liver     History of Present Illness This is the patient's first visit to a gastroenterologist and to the [facility] GI clinic.  At the end of 2020, the patient was found to have a lesion in her abdomen near her adrenal gland.  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  She underwent MRI imaging which suggested a fatty liver and showed the lesion once again.  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  Since the patient surgery she has followed up with surgery but continues to deal with issues of pain and discomfort.  She initially came into the emergency department due to issues of lower abdominal discomfort.  She has a history of chronic constipation since she was a child.  She has tried laxatives in the past including MiraLAX as well as Dulcolax.  She transition to just taking prune juice and that helped her have a few  bowel movements per week.  However the patient states that she has not had a real bowel movement since her surgery which was on March 5.  Patient has not taken any other medications as she was not sure if laxative therapy could be given during postprocedural period.  She has noted some discharge from around the staple line site of her scar although that has stopped at this time.  She has a scheduled follow-up with surgery in a few days.  The patient has been suffering from nausea with vomiting at times.  She feels nauseous nearly the entire day.  She noticed in the last few days some bright red blood per rectum in the toilet.  She does not recall this happening previously.  She believes she has been straining and she has been straining for years.  The patient has taken nonsteroidals in the past.  She denies any overt melena.  She states she has had chronic GERD symptoms and takes Tums on a frequent basis.  She has not taken PPI recently.  The patient has never had an upper or lower endoscopy.  She has continued to have weight loss over the course of the last few weeks since her surgery.  Over the course of the last few months she has lost at least 25 pounds.  She has not been overtly concerned about the weight except that she was unintentionally losing this but she did know that she was slightly overweight.  GI Review of Systems Positive as above including decreased appetite Negative for dysphagia, odynophagia,  hematemesis  Review of Systems General: Denies fevers/chills HEENT: Denies oral lesions Cardiovascular: Denies chest pain/palpitations Pulmonary: Denies shortness of breath/nocturnal cough Gastroenterological: See HPI Genitourinary: Denies darkened urine or hematuria Hematological: Denies easy bruising/bleeding Endocrine: Denies temperature intolerance Dermatological: Denies jaundice Psychological: Mood is anxious to feel better   Medications Current Outpatient Medications  Medication Sig  Dispense Refill  . calcium carbonate (TUMS - DOSED IN MG ELEMENTAL CALCIUM) 500 MG chewable tablet Chew 1 tablet by mouth 2 (two) times daily as needed for indigestion or heartburn.    . diphenhydramine-acetaminophen (TYLENOL PM) 25-500 MG TABS tablet Take 2 tablets by mouth at bedtime as needed (sleep).    . sertraline (ZOLOFT) 50 MG tablet Take 1 tablet (50 mg total) by mouth at bedtime. 90 tablet 3   No current facility-administered medications for this visit.    Allergies No Known Allergies  Histories Past Medical History:  Diagnosis Date  . Anxiety   . Complication of anesthesia    WOKE UP DURING WISDOM TOOTH SURGERY  . Depression   . Deviated septum   . GERD (gastroesophageal reflux disease)   . Headache    Past Surgical History:  Procedure Laterality Date  . ADRENALECTOMY Left 01/24/2020   Procedure: Open Left ADRENALECTOMY;  Surgeon: Fredirick Maudlin, MD;  Location: [facility] ORS;  Service: General;  Laterality: Left;  . BREAST REDUCTION SURGERY    . NASAL SEPTUM SURGERY     X2  . TONSILLECTOMY    . WISDOM TOOTH EXTRACTION     Social History   Socioeconomic History  . Marital status: Married    Spouse name: Not on file  . Number of children: Not on file  . Years of education: Not on file  . Highest education level: Not on file  Occupational History  . Not on file  Tobacco Use  . Smoking status: Never Smoker  . Smokeless tobacco: Never Used  Substance and Sexual Activity  . Alcohol use: Not Currently  . Drug use: Never  . Sexual activity: Yes    Birth control/protection: None    Comment: last sex 24 July 20  Other Topics Concern  . Not on file  Social History Narrative   Married, Sonia Side   Works in social work   Grew up in [place]   Parents decreased    Sister lives in [place] work- in Lexicographer ( like Radiographer, therapeutic)    Social Determinants of Radio broadcast assistant Strain:   . Difficulty of Paying Living Expenses:   Food Insecurity:    . Worried About Charity fundraiser in the Last Year:   . Arboriculturist in the Last Year:   Transportation Needs:   . Film/video editor (Medical):   Marland Kitchen Lack of Transportation (Non-Medical):   Physical Activity:   . Days of Exercise per Week:   . Minutes of Exercise per Session:   Stress:   . Feeling of Stress :   Social Connections:   . Frequency of Communication with Friends and Family:   . Frequency of Social Gatherings with Friends and Family:   . Attends Religious Services:   . Active Member of Clubs or Organizations:   . Attends Archivist Meetings:   Marland Kitchen Marital Status:   Intimate Partner Violence:   . Fear of Current or Ex-Partner:   . Emotionally Abused:   Marland Kitchen Physically Abused:   . Sexually Abused:    Family  History  Problem Relation Age of Onset  . Heart attack Father   . Diabetes Sister   . Hyperlipidemia Sister   . Hypertension Sister   . Heart attack Paternal Aunt   . Heart disease Paternal Aunt   . Heart attack Paternal Aunt   . Heart disease Paternal Aunt   . Heart attack Paternal Aunt   . Heart disease Paternal Aunt   . Colon cancer Neg Hx   . Esophageal cancer Neg Hx   . Pancreatic cancer Neg Hx   . Stomach cancer Neg Hx   . Inflammatory bowel disease Neg Hx   . Liver disease Neg Hx   . Rectal cancer Neg Hx    I have reviewed her medical, social, and family history in detail and updated the electronic medical record as necessary.    PHYSICAL EXAMINATION  BP 104/72 (BP Location: Left Arm, Patient Position: Sitting, Cuff Size: Large)   Pulse 88   Temp 97.7 F (36.5 C)   Ht 5\' 6"  (1.676 m)   Wt 223 lb 4 oz (101.3 kg)   SpO2 99%   BMI 36.03 kg/m  Wt Readings from Last 3 Encounters:  02/18/20 223 lb 4 oz (101.3 kg)  02/04/20 225 lb 3.2 oz (102.2 kg)  02/04/20 225 lb (102.1 kg)  GEN: Mildly anxious but overall in minimal distress, appears stated age, doesn't appear chronically ill, nontoxic PSYCH: Cooperative, without pressured  speech EYE: Conjunctivae pink, sclerae anicteric ENT: MMM NECK: Supple, enlarged neck girth CV: RR without R/Gs  RESP: CTAB posteriorly, without wheezing GI: NABS, soft, protuberant abdomen, rounded, surgical L scar with 2 Steri-Strips still present at the top of the scar with 1 small area that was slightly more erythematous but no evidence of induration, tenderness to palpation throughout the abdomen even with gentle touch, (more discomfort in the lower abdomen bilaterally), volitional guarding present, no rebound, unable to appreciate hepatosplenomegaly  GU: DRE deferred by patient today MSK/EXT: No significant lower extremity edema SKIN: No jaundice, no spider angiomata NEURO:  Alert & Oriented x 3, no focal deficits, no evidence of asterixis   REVIEW OF DATA  I reviewed the following data at the time of this encounter:  GI Procedures and Studies  No relevant studies to review  Laboratory Studies  Reviewed those in epic  Imaging Studies  January 2021 MRI abdomen without contrast IMPRESSION: 1. 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 2. Hepatic steatosis.  December 2020 CT abdomen pelvis with contrast IMPRESSION: 1. No acute abdominopelvic abnormality. Specifically, the appendix is normal. 2. There is a complex primarily fat containing lesion that appears to be centered in the left adrenal gland measuring up to 12.8 cm, containing areas of calcification and soft tissue. There is mass effect on the left kidney and pancreas. 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 However, given its complexity, an outpatient nonemergent contrast enhanced MRI is recommended for further evaluation. 3. Nonobstructive left nephrolithiasis.   ASSESSMENT  Ms. Tamala Julian is a 29 y.o. female with a pmh significant for reported chronic GERD, MDD/anxiety, headaches, recent abdominal teratoma excision, chronic constipation,,  nephrolithiasis, question fatty liver (based on MRI).  The patient is seen today for evaluation and management of:  1. Chronic bilateral lower abdominal pain   2. History of Teratoma   3. BRBPR (bright red blood per rectum)   4. Chronic constipation   5. Non-intractable vomiting with nausea, unspecified vomiting type   6. Gastroesophageal reflux  disease, unspecified whether esophagitis present   7. Fatty liver   8. Abnormal MRI, liver    The patient is hemodynamically stable but has quite a few GI symptoms for [place] to further explore.  How much some of the symptoms are the result of the postoperative stage in the course of the next few weeks for her to heal after she had this large teratoma that was resected and the mass-effect that may have been causing on things is poor for [place] to monitor.  She needs to follow-up with her surgery team which she is doing later this week.  I will work with her in regards to trying to optimize her bowel habits to see if we can help her with that and see if that helps with some of the abdominal discomfort.  We will initiate some PPI therapy as well as antiemetics for symptom relief in an effort of trying to help the patient as well.  Patient likely will need to undergo a diagnostic endoscopy and colonoscopy but I think we need to try to understand the postoperative state at this time first.  In regards to this fatty liver question she does have risk factors in regards to her obesity but we have discussed the role of risk mitigation through weight loss and exercise and a healthy diet including the potential of a Mediterranean diet.  She will need liver tests checked every few months for a few years and as long as they stay stable then we will not necessarily need to consider a liver biopsy but we will begin a partial work-up to rule out viral hepatitides which could also play a role in regards to liver issues down the road.  We will see her back in a few weeks to decide on  endoscopic evaluation and we will see how her bowel regimen as noted below is working.  We also discussed toileting technique to try to aid her symptoms.  She may end up needing pelvic floor retraining via pelvic floor biofeedback.  All patient questions were answered, to the best of my ability, and the patient agrees to the aforementioned plan of action with follow-up as indicated.   PLAN  Laboratories as outlined below KUB to evaluate for stool burden Proceed with scheduling CT abdomen/pelvis with IV and oral contrast to further delineate postoperative state and see what has happened since her procedure and if there could be an etiology for her symptoms Bowel regimen as follows -Increase Prune Juice to 2 times daily -Start Dulcolax 10 mg once daily for 1 day, if no bowel movement increase to twice daily -Try a glycerin suppository nightly until you have bowel movement and once you have bowel movement, then use every other night.  -Trial of Milk of Magnesium if no bowel movement in 48 hours once daily -If still no bowel movement then start Trial of Linzess 145 mcg once daily (samples given) -Enema if no BM and potential for Bowel Preparation Will start PPI once daily 40 mg omeprazole and monitor symptoms We will start Zofran 8 mg oral dissolving tablet every 8 hours as needed to help with symptoms of nausea vomiting Diagnostic endoscopy and colonoscopy likely to be pursued based on symptoms and how patient does but will begin acute work-up as noted above Consider anorectal manometry and pelvic floor biofeedback in future   Orders Placed This Encounter  Procedures  . DG Abd 2 Views  . CT Abdomen Pelvis W Contrast  . CBC  . Basic Metabolic  Panel (BMET)  . Calcium, ionized  . TSH  . Cortisol  . IBC + Ferritin  . Hepatitis A antibody, total  . Hepatitis B Surface AntiBODY  . Hepatitis B Surface AntiGEN  . Hepatitis B Core Antibody, total  . Hepatitis C Antibody    New Prescriptions     No medications on file   Modified Medications   No medications on file    Planned Follow Up No follow-ups on file.   Total Time in Face-to-Face and in Coordination of Care for patient including independent/personal interpretation/review of prior testing, medical history, examination, medication adjustment, communicating results with the patient directly, and documentation with the EHR is greater than 45 minutes.   Justice Britain, MD [facility] Office # [phone]

## 2020-02-19 NOTE — Progress Notes (Signed)
Rx's sent to pt pharmacy.

## 2020-02-20 ENCOUNTER — Telehealth: Payer: Self-pay

## 2020-02-20 ENCOUNTER — Encounter: Payer: 59 | Admitting: General Surgery

## 2020-02-20 MED ORDER — ESOMEPRAZOLE MAGNESIUM 40 MG PO CPDR: 40.0000 mg | DELAYED_RELEASE_CAPSULE | Freq: Every day | ORAL | 2 refills | Status: DC

## 2020-02-20 NOTE — Telephone Encounter (Signed)
Please proceed with change to Nexium equivalent dosing. Thank you. GM

## 2020-02-20 NOTE — Telephone Encounter (Signed)
The insurance kicked the esomeprazole 40mg  capsules out. I called the pharmacy and what they are saying is the alternatives are not going thru either. I called [employer] at [phone] and spoke with [facility]. She looked into this matter and said it needs an exception request. She took the information and she is faxing me the forms. However she said it may not come today because of the workload. I called the pharmacy and informed them and I also called [facility] and informed her. Dr Rush Landmark is also aware. Tomorrow is a holiday and so this may not get taken care of until Monday. Inus said she will get an over the counter PPI if needed.

## 2020-02-20 NOTE — Telephone Encounter (Signed)
Nexium sent to pharmacy

## 2020-02-20 NOTE — Telephone Encounter (Signed)
Dr Rush Landmark I recd a fax back from [facility] stating that Omeprazole is not covered under pt's ins coverage. Nexium is covered. Ok to change?

## 2020-02-24 NOTE — Telephone Encounter (Signed)
Recd form faxed from [facility]. Form filled out and faxed back.

## 2020-02-24 NOTE — Telephone Encounter (Signed)
Recd fax back from [facility] with approval from 02/24/2020-02/23/2021. [facility] has been informed. Mychart message sent to pt with this information.

## 2020-02-28 ENCOUNTER — Ambulatory Visit (HOSPITAL_COMMUNITY)
Admission: RE | Admit: 2020-02-28 | Discharge: 2020-02-28 | Disposition: A | Discharge status: Home / Self Care | Payer: 59 | Source: Ambulatory Visit | Attending: Gastroenterology | Admitting: Gastroenterology

## 2020-02-28 ENCOUNTER — Other Ambulatory Visit: Payer: Self-pay

## 2020-02-28 DIAGNOSIS — D489 Neoplasm of uncertain behavior, unspecified: Secondary | ICD-10-CM

## 2020-02-28 DIAGNOSIS — R112 Nausea with vomiting, unspecified: Secondary | ICD-10-CM | POA: Diagnosis present

## 2020-02-28 DIAGNOSIS — K5909 Other constipation: Secondary | ICD-10-CM | POA: Diagnosis not present

## 2020-02-28 DIAGNOSIS — K760 Fatty (change of) liver, not elsewhere classified: Secondary | ICD-10-CM | POA: Insufficient documentation

## 2020-02-28 MED ORDER — IOHEXOL 300 MG/ML  SOLN
100.0000 mL | Freq: Once | INTRAMUSCULAR | Status: AC | PRN
  Administered 2020-02-28: 100 mL via INTRAVENOUS

## 2020-02-28 MED ORDER — SODIUM CHLORIDE (PF) 0.9 % IJ SOLN
INTRAMUSCULAR | Status: AC
  Filled 2020-02-28: qty 50

## 2020-03-06 ENCOUNTER — Telehealth: Payer: Self-pay | Admitting: Family

## 2020-03-06 ENCOUNTER — Telehealth (INDEPENDENT_AMBULATORY_CARE_PROVIDER_SITE_OTHER): Payer: 59 | Admitting: Family

## 2020-03-06 ENCOUNTER — Encounter: Payer: Self-pay | Admitting: Family

## 2020-03-06 DIAGNOSIS — E278 Other specified disorders of adrenal gland: Secondary | ICD-10-CM

## 2020-03-06 DIAGNOSIS — F4323 Adjustment disorder with mixed anxiety and depressed mood: Secondary | ICD-10-CM

## 2020-03-06 NOTE — Patient Instructions (Addendum)
So nice to speak with you, let me know if you need at all.

## 2020-03-06 NOTE — Progress Notes (Signed)
Virtual Visit via Video Note  I connected with@  on 03/06/20 at 11:00 AM EDT by a video enabled telemedicine application and verified that I am speaking with the correct person using two identifiers.  Location patient: home Location provider:work  Persons participating in the virtual visit: patient, provider  I discussed the limitations of evaluation and management by telemedicine and the availability of in person appointments. The patient expressed understanding and agreed to proceed.  Interactive audio and video telecommunications were attempted between this provider and patient, however failed, due to patient having technical difficulties or patient did not have access to video capability.  We continued and completed visit with audio only.    HPI: Follow up Doing much better , 'mentally and physically'. Sleeping now and 'that is making the difference.' Doesn't feel she needs zoloft. Back in [place].  Surgical wound has still not closed. No longer having purulent discharge from wound.  No abdominal pain, fever, chills, vomiting. She is not taking esomeprazole as symptoms resolved.   Recently seen by GI, Mansouraty.prescribed esomeprazole Pending KUB 02/28/20 CT abdomen pelvis-significant for residual fluid collection in the surgical bed which may represent a seroma, ? abscess  Follow up with Dr Rush Landmark 03/20/20 and Dr Kelton Pillar 06/08/20.  ROS: See pertinent positives and negatives per HPI.  Past Medical History:  Diagnosis Date  . Anxiety   . Complication of anesthesia    WOKE UP DURING WISDOM TOOTH SURGERY  . Depression   . Deviated septum   . GERD (gastroesophageal reflux disease)   . Headache     Past Surgical History:  Procedure Laterality Date  . ADRENALECTOMY Left 01/24/2020   Procedure: Open Left ADRENALECTOMY;  Surgeon: Fredirick Maudlin, MD;  Location: [facility] ORS;  Service: General;  Laterality: Left;  . BREAST REDUCTION SURGERY    . NASAL SEPTUM SURGERY     X2  .  TONSILLECTOMY    . WISDOM TOOTH EXTRACTION      Family History  Problem Relation Age of Onset  . Heart attack Father   . Diabetes Sister   . Hyperlipidemia Sister   . Hypertension Sister   . Heart attack Paternal Aunt   . Heart disease Paternal Aunt   . Heart attack Paternal Aunt   . Heart disease Paternal Aunt   . Heart attack Paternal Aunt   . Heart disease Paternal Aunt   . Colon cancer Neg Hx   . Esophageal cancer Neg Hx   . Pancreatic cancer Neg Hx   . Stomach cancer Neg Hx   . Inflammatory bowel disease Neg Hx   . Liver disease Neg Hx   . Rectal cancer Neg Hx       No current outpatient medications on file.  EXAM:    PSYCH/NEURO: pleasant and cooperative, no obvious depression or anxiety, speech and thought processing grossly intact  ASSESSMENT AND PLAN:  Discussed the following assessment and plan:  Left adrenal mass (HCC)  Adjustment reaction with anxiety and depression Problem List Items Addressed This Visit      Other   Adjustment reaction with anxiety and depression    Resolved. Very pleased with conversation today/  As physical symptoms have improved, patient's distress has very much as well.  She reports no anxiety or depression today.  She declines politely being on the Zoloft at this time and she does not feel medication necessary.  I emphasized to her to letting me know if she felt that this were to change.  She will let  me know.        Left adrenal mass (HCC)    Follow-up scheduled with Dr. Kelton Pillar, will follow         -we discussed possible serious and likely etiologies, options for evaluation and workup, limitations of telemedicine visit vs in person visit, treatment, treatment risks and precautions. Pt prefers to treat via telemedicine empirically rather then risking or undertaking an in person visit at this moment. Patient agrees to seek prompt in person care if worsening, new symptoms arise, or if is not improving with treatment.   I  discussed the assessment and treatment plan with the patient. The patient was provided an opportunity to ask questions and all were answered. The patient agreed with the plan and demonstrated an understanding of the instructions.   The patient was advised to call back or seek an in-person evaluation if the symptoms worsen or if the condition fails to improve as anticipated.   Mable Paris, FNP   I have spent 16 minutes with a patient including precharting, e reviewing medical records, and discussion plan of care.

## 2020-03-06 NOTE — Assessment & Plan Note (Signed)
Resolved. Very pleased with conversation today/  As physical symptoms have improved, patient's distress has very much as well.  She reports no anxiety or depression today.  She declines politely being on the Zoloft at this time and she does not feel medication necessary.  I emphasized to her to letting me know if she felt that this were to change.  She will let me know.

## 2020-03-06 NOTE — Assessment & Plan Note (Signed)
Follow-up scheduled with Dr. Kelton Pillar, will follow

## 2020-03-06 NOTE — Telephone Encounter (Signed)
Tried to reach patient to set up 3 month physical but her mailbox was full.

## 2020-03-12 ENCOUNTER — Other Ambulatory Visit: Payer: Self-pay

## 2020-03-12 ENCOUNTER — Ambulatory Visit (INDEPENDENT_AMBULATORY_CARE_PROVIDER_SITE_OTHER): Payer: Self-pay | Admitting: General Surgery

## 2020-03-12 ENCOUNTER — Encounter: Payer: Self-pay | Admitting: General Surgery

## 2020-03-12 VITALS — BP 126/87 | HR 82 | Temp 98.4°F | Ht 67.0 in | Wt 218.4 lb

## 2020-03-12 DIAGNOSIS — D3502 Benign neoplasm of left adrenal gland: Secondary | ICD-10-CM

## 2020-03-12 NOTE — Progress Notes (Signed)
[name] is here today for a postoperative visit.  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  At her first postop visit on March 16,, she had been feeling lightheaded and we thought perhaps she was not being adequately hydrated.  Her staples were removed at that time.  She called [place] multiple times after that day with various complaints.  She was encouraged to either make an appointment or be seen in the emergency department, but she did neither, it seems.  She ultimately ended up seeing a gastroenterologist for some of her complaints.  He performed a CT scan and sent it to me for review.  She had a seroma in her adrenalectomy bed, but no other concerns from postoperative standpoint.  [name] says that she is being treated for severe constipation by him.  She has been having other issues with her mood and other concerns, which are being addressed by other providers.  Today, she feels like the majority of her symptoms and complaints are GI in origin, but wanted some reassurance from me that there is nothing untoward going on with her surgical site.  She states that she did have an opening in the incision that is now closed.  She endorses a burning or stinging sensation along the incision site.  She says her abdominal wall along the lateral extension of the [place] incision feels firm to her compared to the other side.   Today's Vitals   03/12/20 1045  BP: 126/87  Pulse: 82  Temp: 98.4 F (36.9 C)  TempSrc: Temporal  SpO2: 96%  Weight: 218 lb 6.4 oz (99.1 kg)  Height: 5\' 7"  (1.702 m)  PainSc: 5   PainLoc: Abdomen   Body mass index is 34.21 kg/m. Focused abdominal exam demonstrates that the incision is completely closed and healed.  No hernia appreciated within the surgical site.  She does have some scar hypertrophy and hypersensitivity to light touch.  Today, she was reassured that the firmness she feels along the lateral aspect of the  incision is simply normal postoperative healing, as those muscles were divided and had to be sewn back together.  She was reassured that the fluid collection in the adrenalectomy bed represented a seroma and that her body would ultimately resorb it over time.  I offered to prescribe Lyrica or Neurontin for what sounds like mild nerve pain along the incision.  She was reassured that it is normal to cut to those nerves when making an incision and that over time, they usually heal leaving only some numbness, if anything at all.  She declined the offer of this prescription.  She says she feels much better having spoken to me and will follow up with her other providers.

## 2020-03-12 NOTE — Patient Instructions (Addendum)
Dr.Cannon discussed with patient to discussion with the Gastroenterologist.   Dr.Cannon discussed with patient with time the area will heal completely and it may take up to six months.

## 2020-03-20 ENCOUNTER — Encounter: Payer: Self-pay | Admitting: Gastroenterology

## 2020-03-20 ENCOUNTER — Ambulatory Visit: Payer: 59 | Admitting: Gastroenterology

## 2020-03-20 VITALS — BP 118/80 | HR 84 | Temp 98.1°F | Ht 66.0 in | Wt 218.2 lb

## 2020-03-20 DIAGNOSIS — R1032 Left lower quadrant pain: Secondary | ICD-10-CM

## 2020-03-20 DIAGNOSIS — K5909 Other constipation: Secondary | ICD-10-CM

## 2020-03-20 DIAGNOSIS — R112 Nausea with vomiting, unspecified: Secondary | ICD-10-CM

## 2020-03-20 DIAGNOSIS — G8929 Other chronic pain: Secondary | ICD-10-CM

## 2020-03-20 DIAGNOSIS — R1031 Right lower quadrant pain: Secondary | ICD-10-CM | POA: Diagnosis not present

## 2020-03-20 DIAGNOSIS — K219 Gastro-esophageal reflux disease without esophagitis: Secondary | ICD-10-CM | POA: Diagnosis not present

## 2020-03-20 DIAGNOSIS — R9389 Abnormal findings on diagnostic imaging of other specified body structures: Secondary | ICD-10-CM

## 2020-03-20 MED ORDER — LINACLOTIDE 290 MCG PO CAPS: 290.0000 ug | ORAL_CAPSULE | Freq: Every day | ORAL | 0 refills | Status: DC

## 2020-03-20 MED ORDER — ESOMEPRAZOLE MAGNESIUM 40 MG PO CPDR
40.0000 mg | DELAYED_RELEASE_CAPSULE | Freq: Every day | ORAL | 2 refills | Status: DC
Start: 2020-03-20 — End: 2020-05-01

## 2020-03-20 MED ORDER — SUTAB 1479-225-188 MG PO TABS: 1.0000 | ORAL_TABLET | ORAL | 0 refills | Status: DC

## 2020-03-20 NOTE — Patient Instructions (Signed)
We have sent the following medications to your pharmacy for you to pick up at your convenience: Nexium 40 mg daily to [facility].   You have been given Linzess 290 mcg samples to take one capsule by mouth daily every morning.   You have been scheduled for an endoscopy and colonoscopy. Please follow the written instructions given to you at your visit today. Please pick up your prep supplies at the pharmacy within the next 1-3 days. If you use inhalers (even only as needed), please bring them with you on the day of your procedure.

## 2020-03-21 ENCOUNTER — Encounter: Payer: Self-pay | Admitting: Gastroenterology

## 2020-03-21 DIAGNOSIS — R9389 Abnormal findings on diagnostic imaging of other specified body structures: Secondary | ICD-10-CM | POA: Insufficient documentation

## 2020-03-21 NOTE — Progress Notes (Signed)
[place] VISIT   Primary Care Provider Burnard Hawthorne, FNP [address] [place] [address] [phone]  Patient Profile: Terri Cochran is a 29 y.o. female with a pmh significant for reported chronic GERD, MDD/anxiety, headaches, recent abdominal teratoma excision with likely seroma based on recent CT imaging, chronic constipation, nephrolithiasis, question fatty liver (based on MRI).  The patient presents to the [facility] for an evaluation and management of problem(s) noted below:  Problem List 1. Chronic constipation   2. Chronic bilateral lower abdominal pain   3. Non-intractable vomiting with nausea, unspecified vomiting type   4. Gastroesophageal reflux disease, unspecified whether esophagitis present   5. Abnormal CT scan     History of Present Illness Please see initial consultation note for full details of HPI  Interval History Since our clinic visit, we proceeded with a CT abdomen pelvis to further evaluate postsurgical potential issues.  She was found to have a fluid collection in the region of her surgical bed which was felt to be a potential seroma although an abscess cannot be excluded.  Nephrolithiasis was also found on the left kidney without obstructing pathology.  Patient followed up with her surgeon, Dr. Celine Ahr, and was told about the findings as well.  At the time the patient felt that she was reassured.  The clinic note suggested that the patient was feeling much better having an opportunity to speak with her at the time.  Today, however, the patient states she is continuing to have significant issues.  She came today's clinic visit thinking that we needed to aspirate the fluid collection.  Patient still having significant constipation.  Not clear that the 145 mcg Linzess was significantly helpful for her.  She still having bowel movements only after every few days.  She is not doing suppositories or  enemas.  Patient's pain is severe and preventing her from being able to do some things that she would normally do.  She was supposed to go out of town this weekend but states that she could be seen in clinic today.    GI Review of Systems Positive as above including persistent anorexia, bloating, distention Negative for odynophagia, dysphagia, melena, hematochezia  Review of Systems General: Denies fevers/chills Cardiovascular: Denies chest pain/palpitations Pulmonary: Denies shortness of breath/nocturnal cough Gastroenterological: See HPI Genitourinary: Denies darkened urine Hematological: Denies easy bruising/bleeding Dermatological: Denies jaundice Psychological: Mood remains significantly anxious to get better   Medications Current Outpatient Medications  Medication Sig Dispense Refill  . fluconazole (DIFLUCAN) 150 MG tablet fluconazole 150 mg tablet  Take 1 tablet by oral route as directed for 4 days.    Marland Kitchen esomeprazole (NEXIUM) 40 MG capsule Take 1 capsule (40 mg total) by mouth daily at 12 noon. 30 capsule 2  . linaclotide (LINZESS) 290 MCG CAPS capsule Take 1 capsule (290 mcg total) by mouth daily before breakfast. 36 capsule 0  . Sodium Sulfate-Mag Sulfate-KCl (SUTAB) [phone] MG TABS Take 1 kit by mouth as directed. 12 tablet 0   No current facility-administered medications for this visit.    Allergies No Known Allergies  Histories Past Medical History:  Diagnosis Date  . Anxiety   . Complication of anesthesia    WOKE UP DURING WISDOM TOOTH SURGERY  . Depression   . Deviated septum   . GERD (gastroesophageal reflux disease)   . Headache    Past Surgical History:  Procedure Laterality Date  . ADRENALECTOMY Left 01/24/2020   Procedure: Open  Left ADRENALECTOMY;  Surgeon: Fredirick Maudlin, MD;  Location: [facility] ORS;  Service: General;  Laterality: Left;  . BREAST REDUCTION SURGERY    . NASAL SEPTUM SURGERY     X2  . TONSILLECTOMY    . WISDOM TOOTH EXTRACTION      Social History   Socioeconomic History  . Marital status: Divorced    Spouse name: Not on file  . Number of children: 0  . Years of education: Not on file  . Highest education level: Not on file  Occupational History  . Not on file  Tobacco Use  . Smoking status: Never Smoker  . Smokeless tobacco: Never Used  Substance and Sexual Activity  . Alcohol use: Not Currently  . Drug use: Never  . Sexual activity: Yes    Birth control/protection: None    Comment: last sex 24 July 20  Other Topics Concern  . Not on file  Social History Narrative   Married, Sonia Side   Works in social work   Grew up in [place]   Parents decreased    Sister lives in [place] work- in Lexicographer ( like Radiographer, therapeutic)    Social Determinants of Radio broadcast assistant Strain:   . Difficulty of Paying Living Expenses:   Food Insecurity:   . Worried About Charity fundraiser in the Last Year:   . Arboriculturist in the Last Year:   Transportation Needs:   . Film/video editor (Medical):   Marland Kitchen Lack of Transportation (Non-Medical):   Physical Activity:   . Days of Exercise per Week:   . Minutes of Exercise per Session:   Stress:   . Feeling of Stress :   Social Connections:   . Frequency of Communication with Friends and Family:   . Frequency of Social Gatherings with Friends and Family:   . Attends Religious Services:   . Active Member of Clubs or Organizations:   . Attends Archivist Meetings:   Marland Kitchen Marital Status:   Intimate Partner Violence:   . Fear of Current or Ex-Partner:   . Emotionally Abused:   Marland Kitchen Physically Abused:   . Sexually Abused:    Family History  Problem Relation Age of Onset  . Heart attack Father   . Diabetes Sister   . Hyperlipidemia Sister   . Hypertension Sister   . Heart attack Paternal Aunt   . Heart disease Paternal Aunt   . Heart attack Paternal Aunt   . Heart disease Paternal Aunt   . Heart attack Paternal Aunt   . Heart  disease Paternal Aunt   . Colon cancer Neg Hx   . Esophageal cancer Neg Hx   . Pancreatic cancer Neg Hx   . Stomach cancer Neg Hx   . Inflammatory bowel disease Neg Hx   . Liver disease Neg Hx   . Rectal cancer Neg Hx    I have reviewed her medical, social, and family history in detail and updated the electronic medical record as necessary.    PHYSICAL EXAMINATION  BP 118/80   Pulse 84   Temp 98.1 F (36.7 C)   Ht '5\' 6"'  (1.676 m)   Wt 218 lb 4 oz (99 kg)   LMP 02/28/2020   BMI 35.23 kg/m  Wt Readings from Last 3 Encounters:  03/20/20 218 lb 4 oz (99 kg)  03/12/20 218 lb 6.4 oz (99.1 kg)  03/06/20 223 lb (101.2 kg)  GEN: Nontoxic appearing, anxious, PSYCH: Cooperative, without pressured speech EYE: Conjunctivae pink, sclerae anicteric ENT: MMM CV: Nontachycardic RESP: No audible wheezing present GI: NABS, soft, protuberant abdomen, rounded, surgical scar present, tenderness to palpation throughout abdomen, volitional guarding, no rebound MSK/EXT: No significant lower extremity edema SKIN: No jaundice, no spider angiomata NEURO:  Alert & Oriented x 3, no focal deficits, no evidence of asterixis   REVIEW OF DATA  I reviewed the following data at the time of this encounter:  GI Procedures and Studies  No relevant studies to review  Laboratory Studies  Reviewed those in epic  Imaging Studies  April 2021 CT abdomen pelvis with contrast IMPRESSION: 1. Left adrenalectomy with a residual fluid collection in the surgical bed, which may represent a seroma. Difficult to definitively exclude an abscess. Please correlate clinically. 2. Small left renal stone.   ASSESSMENT  Ms. Tamala Julian is a 29 y.o. female  with a pmh significant for reported chronic GERD, MDD/anxiety, headaches, recent abdominal teratoma excision with likely seroma based on recent CT imaging, chronic constipation, nephrolithiasis, question fatty liver (based on MRI).  The patient is seen today for evaluation  and management of:  1. Chronic constipation   2. Chronic bilateral lower abdominal pain   3. Non-intractable vomiting with nausea, unspecified vomiting type   4. Gastroesophageal reflux disease, unspecified whether esophagitis present   5. Abnormal CT scan    The patient is hemodynamically stable.  Clinically, the only issue found on sectional imaging was a potential seroma.  I do not think that this needs to be aspirated as her surgeon did not believe it either.  However if her endoscopic work-up that we will proceed with is unremarkable and she continues to have issues and her constipation is improved and still having issues then we can certainly can consider the role of an IR guided aspiration attempt although this may be higher risk and risk.  A diagnostic endoscopy will help guide some management options and a diagnostic colonoscopy as well.  We will increase her Linzess to 290 mcg daily to see how she does.  Certainly can consider Trulance or Amitiza although she is of childbearing age so I would be hesitant Amitiza use for now.  We went over trying to get Nexium on a daily basis, and she will use the good Rx program to try and get it at a cheaper payment overall.  In regards to her previously noted fatty liver, this does not seem to be a significant issue on today's CT scan and her liver tests were normal when checked recently.  It is important for the patient to continue to work on weight loss in regards to the consideration of possible metabolic associated liver disease and maintain a healthy diet.  We would likely need to do pelvic floor retraining via physical therapy in the future.  I will do a good digital rectal exam at the time of her colonoscopy evaluation.  The risks and benefits of endoscopic evaluation were discussed with the patient; these include but are not limited to the risk of perforation, infection, bleeding, missed lesions, lack of diagnosis, severe illness requiring  hospitalization, as well as anesthesia and sedation related illnesses.  The patient is agreeable to proceed.  All patient questions were answered, to the best of my ability, and the patient agrees to the aforementioned plan of action with follow-up as indicated.   PLAN  Proceed with scheduling colonoscopy diagnostic Proceed with scheduling endoscopy diagnostic Bowel regimen as follows -  Patient may use prune juice 1-2 times daily -Please try a glycerin suppository nightly until you have bowel movement and once you have bowel movement, then use every other night.  -Linzess 290 mcg once daily (samples given)  -Enema if no BM and potential for Bowel Preparation Repeat prescription for once daily Nexium Zofran as needed Consider anorectal manometry and pelvic floor biofeedback in future Hold on seroma aspiration for now per her surgeon   Orders Placed This Encounter  Procedures  . Ambulatory referral to Gastroenterology    New Prescriptions   ESOMEPRAZOLE (NEXIUM) 40 MG CAPSULE    Take 1 capsule (40 mg total) by mouth daily at 12 noon.   LINACLOTIDE (LINZESS) 290 MCG CAPS CAPSULE    Take 1 capsule (290 mcg total) by mouth daily before breakfast.   SODIUM SULFATE-MAG SULFATE-KCL (SUTAB) [phone] MG TABS    Take 1 kit by mouth as directed.   Modified Medications   No medications on file    Planned Follow Up No follow-ups on file.   Total Time in Face-to-Face and in Coordination of Care for patient including independent/personal interpretation/review of prior testing, medical history, examination, medication adjustment, communicating results with the patient directly, and documentation with the EHR is greater than 25 minutes.   Justice Britain, MD [facility]oscopy Office # [phone]

## 2020-04-24 ENCOUNTER — Other Ambulatory Visit: Payer: Self-pay | Admitting: Gastroenterology

## 2020-04-24 ENCOUNTER — Ambulatory Visit (INDEPENDENT_AMBULATORY_CARE_PROVIDER_SITE_OTHER): Payer: 59

## 2020-04-24 DIAGNOSIS — Z1159 Encounter for screening for other viral diseases: Secondary | ICD-10-CM

## 2020-04-25 LAB — SARS CORONAVIRUS 2 (TAT 6-24 HRS): SARS Coronavirus 2: NEGATIVE

## 2020-04-27 ENCOUNTER — Other Ambulatory Visit: Payer: Self-pay

## 2020-04-27 ENCOUNTER — Telehealth: Payer: Self-pay | Admitting: Gastroenterology

## 2020-04-27 MED ORDER — SUTAB 1479-225-188 MG PO TABS: 1.0000 | ORAL_TABLET | ORAL | 0 refills | Status: DC

## 2020-04-27 NOTE — Telephone Encounter (Signed)
Looks like the [place] was sent to the pharmacy by Chong Sicilian, RN

## 2020-04-27 NOTE — Telephone Encounter (Signed)
Pt is scheduled for a colonoscopy 04/28/20 and reported that she had eaten popcorn last night 04/26/20.  She also requested prep rx to be resent to [facility] on [address] in [place], [place].

## 2020-04-28 ENCOUNTER — Telehealth: Payer: Self-pay | Admitting: Gastroenterology

## 2020-04-28 ENCOUNTER — Encounter: Payer: 59 | Admitting: Gastroenterology

## 2020-04-28 NOTE — Telephone Encounter (Addendum)
Terri Cochran, no problem. Pt has not followed through on her end. Instructions and Rx were given  in plenty of time for pt to I have already had this taken care. I  have called and spoke with Candace at [facility]([place], [place])- confirmed that they have prescription for Sutab that will be ordered today, in-stock tomorrow and cost to pt is only $40.00 dollars.Candace also let me know that they had tried reaching pt to get her information as they had never filled prescriptions  for her and pt never returned their call. Information was given by myself and rx has been processed. I called pt x2 and left detailed message regarding this. I will also send mychart message to pt to let her know.

## 2020-04-28 NOTE — Telephone Encounter (Signed)
Thank you Tye Maryland for letting me know.

## 2020-04-28 NOTE — Telephone Encounter (Signed)
Ro this pt has sent several messages thru My Chart about her prep not being sent and not being given instructions.  She was seen in the office and instructions were provided and prep sent.  She did not pick it up in time and the pharmacy d/c'd the prescription.  I resent the prep and sent her a copy of the instructions.  She now wants another prep due to the cost of sutab.  I know you have a way to send it can you please help with the code or whatever is needed for her.  Sorry I tried to handle it without bothering you.

## 2020-04-28 NOTE — Telephone Encounter (Signed)
Hey Dr. Rush Landmark- this patient cancelled her endo/colon for today. She states the pharmacy was out of the prep that was called in and was not able to get it in time. She has rescheduled for Friday 6/11.

## 2020-04-28 NOTE — Telephone Encounter (Signed)
Thank you for update. GM 

## 2020-04-28 NOTE — Telephone Encounter (Signed)
Pt called to let you know that she heard your message.

## 2020-04-28 NOTE — Telephone Encounter (Signed)
Thank you for all your help!

## 2020-04-28 NOTE — Telephone Encounter (Signed)
[facility] [address] [facility] the [employer] per [employer]. [facility] called and given S[facility]ab coupon. $40 cost for the patient. [facility]   Patient notified and she will get the [facility] today!

## 2020-04-28 NOTE — Telephone Encounter (Signed)
Pt stated that the prep solution is almost $200 and requested an alternative.

## 2020-05-01 ENCOUNTER — Other Ambulatory Visit: Payer: Self-pay

## 2020-05-01 ENCOUNTER — Encounter: Payer: Self-pay | Admitting: Gastroenterology

## 2020-05-01 ENCOUNTER — Ambulatory Visit (AMBULATORY_SURGERY_CENTER): Payer: 59 | Admitting: Gastroenterology

## 2020-05-01 VITALS — BP 106/64 | HR 65 | Temp 98.0°F | Resp 11 | Ht 66.0 in | Wt 218.0 lb

## 2020-05-01 DIAGNOSIS — R1032 Left lower quadrant pain: Secondary | ICD-10-CM

## 2020-05-01 DIAGNOSIS — R1030 Lower abdominal pain, unspecified: Secondary | ICD-10-CM

## 2020-05-01 DIAGNOSIS — K2970 Gastritis, unspecified, without bleeding: Secondary | ICD-10-CM

## 2020-05-01 DIAGNOSIS — K641 Second degree hemorrhoids: Secondary | ICD-10-CM

## 2020-05-01 DIAGNOSIS — K5909 Other constipation: Secondary | ICD-10-CM

## 2020-05-01 DIAGNOSIS — K2990 Gastroduodenitis, unspecified, without bleeding: Secondary | ICD-10-CM | POA: Diagnosis not present

## 2020-05-01 DIAGNOSIS — B9681 Helicobacter pylori [H. pylori] as the cause of diseases classified elsewhere: Secondary | ICD-10-CM | POA: Diagnosis not present

## 2020-05-01 DIAGNOSIS — B9781 Human metapneumovirus as the cause of diseases classified elsewhere: Secondary | ICD-10-CM

## 2020-05-01 DIAGNOSIS — K644 Residual hemorrhoidal skin tags: Secondary | ICD-10-CM | POA: Diagnosis not present

## 2020-05-01 DIAGNOSIS — G8921 Chronic pain due to trauma: Secondary | ICD-10-CM

## 2020-05-01 DIAGNOSIS — K2950 Unspecified chronic gastritis without bleeding: Secondary | ICD-10-CM | POA: Diagnosis not present

## 2020-05-01 DIAGNOSIS — R1031 Right lower quadrant pain: Secondary | ICD-10-CM

## 2020-05-01 MED ORDER — LINACLOTIDE 145 MCG PO CAPS: 290.0000 ug | ORAL_CAPSULE | Freq: Every day | ORAL | Status: DC

## 2020-05-01 MED ORDER — ESOMEPRAZOLE MAGNESIUM 40 MG PO CPDR
40.0000 mg | DELAYED_RELEASE_CAPSULE | Freq: Every day | ORAL | 2 refills | Status: DC
Start: 2020-05-01 — End: 2020-09-14

## 2020-05-01 MED ORDER — SODIUM CHLORIDE 0.9 % IV SOLN: 500.0000 mL | Freq: Once | INTRAVENOUS | Status: DC

## 2020-05-01 MED ORDER — LINACLOTIDE 290 MCG PO CAPS: 290.0000 ug | ORAL_CAPSULE | Freq: Every day | ORAL | 2 refills | Status: DC

## 2020-05-01 NOTE — Progress Notes (Signed)
VS- Terri Cochran. °

## 2020-05-01 NOTE — Progress Notes (Signed)
PT taken to PACU. Monitors in place. VSS. Report given to RN.  Propofol given at beginning and throughout procedure to maintain safe and appropriate comfort level. VO/MD.  K.Abid Bolla, CRNA 

## 2020-05-01 NOTE — Progress Notes (Signed)
Called to room to assist during endoscopic procedure.  Patient ID and intended procedure confirmed with present staff. Received instructions for my participation in the procedure from the performing physician.  

## 2020-05-01 NOTE — Op Note (Signed)
[facility] Patient Name: Terri Cochran Procedure Date: 05/01/2020 8:30 AM MRN: [id number] Endoscopist: Justice Britain , MD Age: 29 Referring MD:  Date of Birth: 02-28-1991 Gender: Female Account #: [id number] Procedure:                Colonoscopy Indications:              Lower abdominal pain, Constipation Medicines:                Monitored Anesthesia Care Procedure:                Pre-Anesthesia Assessment:                           - Prior to the procedure, a History and Physical                            was performed, and patient medications and                            allergies were reviewed. The patient's tolerance of                            previous anesthesia was also reviewed. The risks                            and benefits of the procedure and the sedation                            options and risks were discussed with the patient.                            All questions were answered, and informed consent                            was obtained. Prior Anticoagulants: The patient has                            taken no previous anticoagulant or antiplatelet                            agents. ASA Grade Assessment: II - A patient with                            mild systemic disease. After reviewing the risks                            and benefits, the patient was deemed in                            satisfactory condition to undergo the procedure.                           After obtaining informed consent, the colonoscope  was passed under direct vision. Throughout the                            procedure, the patient's blood pressure, pulse, and                            oxygen saturations were monitored continuously. The                            Colonoscope was introduced through the anus and                            advanced to the 5 cm into the ileum. The                            colonoscopy was performed without  difficulty. The                            patient tolerated the procedure. The quality of the                            bowel preparation was good. The terminal ileum,                            ileocecal valve, appendiceal orifice, and rectum                            were photographed. Scope In: 0:27:74 AM Scope Out: 1:28:78 AM Scope Withdrawal Time: 0 hours 6 minutes 20 seconds  Total Procedure Duration: 0 hours 9 minutes 37 seconds  Findings:                 Skin tags were found on perianal exam.                           The digital rectal exam findings include                            hemorrhoids. Pertinent negatives include no                            palpable rectal lesions.                           The terminal ileum and ileocecal valve appeared                            normal.                           Normal mucosa was found in the entire colon.                           Non-bleeding non-thrombosed internal hemorrhoids  and small external hemorrhoids were found during                            retroflexion, during perianal exam and during                            digital exam. The hemorrhoids were Grade II                            (internal hemorrhoids that prolapse but reduce                            spontaneously). Complications:            No immediate complications. Estimated Blood Loss:     Estimated blood loss: none. Impression:               - Perianal skin tags found on perianal exam.                           - Hemorrhoids found on digital rectal exam.                           - The examined portion of the ileum was normal.                           - Normal mucosa in the entire examined colon.                           - Non-bleeding non-thrombosed internal and small                            external hemorrhoids. Recommendation:           - The patient will be observed post-procedure,                            until  all discharge criteria are met.                           - Discharge patient to home.                           - Patient has a contact number available for                            emergencies. The signs and symptoms of potential                            delayed complications were discussed with the                            patient. Return to normal activities tomorrow.                            Written discharge instructions were provided to the  patient.                           - Use FiberCon 1-2 tablets PO daily.                           - Continue Linzess 290 mcg. If issues persist with                            use then will consider transition to Trulance.                           - May use Miralax daily right now as she is very                            clean at this time from a colon perspective to keep                            things moving as nicely as possible from a motility                            perspective.                           - The findings and recommendations were discussed                            with the patient.                           - Repeat colonoscopy at age 45 for screening                            purposes. Justice Britain, MD 05/01/2020 9:10:02 AM

## 2020-05-01 NOTE — Op Note (Signed)
[facility] Patient Name: Terri Cochran Procedure Date: 05/01/2020 8:31 AM MRN: [id number] Endoscopist: Justice Britain , MD Age: 29 Referring MD:  Date of Birth: [birth year] Gender: Female Account #: [id number] Procedure:                Upper GI endoscopy Indications:              Lower abdominal pain, Gastro-esophageal reflux                            disease, Nausea with vomiting Medicines:                Monitored Anesthesia Care Procedure:                Pre-Anesthesia Assessment:                           - Prior to the procedure, a History and Physical                            was performed, and patient medications and                            allergies were reviewed. The patient's tolerance of                            previous anesthesia was also reviewed. The risks                            and benefits of the procedure and the sedation                            options and risks were discussed with the patient.                            All questions were answered, and informed consent                            was obtained. Prior Anticoagulants: The patient has                            taken no previous anticoagulant or antiplatelet                            agents. ASA Grade Assessment: II - A patient with                            mild systemic disease. After reviewing the risks                            and benefits, the patient was deemed in                            satisfactory condition to undergo the procedure.  After obtaining informed consent, the endoscope was                            passed under direct vision. Throughout the                            procedure, the patient's blood pressure, pulse, and                            oxygen saturations were monitored continuously. The                            Endoscope was introduced through the mouth, and                            advanced to the second part  of duodenum. The upper                            GI endoscopy was accomplished without difficulty.                            The patient tolerated the procedure. Scope In: Scope Out: Findings:                 No gross lesions were noted in the entire                            esophagus. Biopsies were taken with a cold forceps                            for histology.                           The Z-line was regular and was found 37 cm from the                            incisors.                           Patchy moderately erythematous mucosa without                            bleeding was found in the cardia, in the gastric                            fundus and in the gastric antrum.                           No gross lesions were noted in the entire examined                            stomach. Biopsies were taken with a cold forceps                            for histology  and Helicobacter pylori testing.                           No gross lesions were noted in the duodenal bulb,                            in the first portion of the duodenum and in the                            second portion of the duodenum. Biopsies were taken                            with a cold forceps for histology. Complications:            No immediate complications. Estimated Blood Loss:     Estimated blood loss was minimal. Impression:               - No gross lesions in esophagus. Biopsied.                           - Z-line regular, 37 cm from the incisors.                           - Erythematous mucosa in the cardia, gastric fundus                            and antrum.                           - No gross lesions in the stomach. Biopsied.                           - No gross lesions in the duodenal bulb, in the                            first portion of the duodenum and in the second                            portion of the duodenum. Biopsied. Recommendation:           - Proceed to scheduled  colonoscopy.                           - Continue 1 daily PPI though would probably                            benefit from the amount of gastritis to begin twice                            daily - will discuss with patient.                           - Await pathology results and treat HP if positive.                           -  The findings and recommendations were discussed                            with the patient. Justice Britain, MD 05/01/2020 9:05:32 AM

## 2020-05-01 NOTE — Patient Instructions (Addendum)
HANDOUTS PROVIDED ON:   The biopsies taken today have been sent for pathology.  The results can take 1-3 weeks to receive.  When your next colonoscopy should occur will be based on the pathology results.    You may resume your previous diet and medication schedule.    A prescription for Nexium 40 mg daily has been sent to your pharmacy.  Continue taking the 290 mcg Linzess.  You may use Miralax daily to keep motility.  Use FiberCon 1-2 tablets daily.  Thank you for allowing [place] to care for you today!!!   YOU HAD AN ENDOSCOPIC PROCEDURE TODAY AT [place]:   Refer to the procedure report that was given to you for any specific questions about what was found during the examination.  If the procedure report does not answer your questions, please call your gastroenterologist to clarify.  If you requested that your care partner not be given the details of your procedure findings, then the procedure report has been included in a sealed envelope for you to review at your convenience later.  YOU SHOULD EXPECT: Some feelings of bloating in the abdomen. Passage of more gas than usual.  Walking can help get rid of the air that was put into your GI tract during the procedure and reduce the bloating. If you had a lower endoscopy (such as a colonoscopy or flexible sigmoidoscopy) you may notice spotting of blood in your stool or on the toilet paper. If you underwent a bowel prep for your procedure, you may not have a normal bowel movement for a few days.  Please Note:  You might notice some irritation and congestion in your nose or some drainage.  This is from the oxygen used during your procedure.  There is no need for concern and it should clear up in a day or so.  SYMPTOMS TO REPORT IMMEDIATELY:   Following lower endoscopy (colonoscopy or flexible sigmoidoscopy):  Excessive amounts of blood in the stool  Significant tenderness or worsening of abdominal pains  Swelling of the abdomen that  is new, acute  Fever of 100F or higher   Following upper endoscopy (EGD)  Vomiting of blood or coffee ground material  New chest pain or pain under the shoulder blades  Painful or persistently difficult swallowing  New shortness of breath  Fever of 100F or higher  Black, tarry-looking stools  For urgent or emergent issues, a gastroenterologist can be reached at any hour by calling [phone]. Do not use MyChart messaging for urgent concerns.    DIET:  We do recommend a small meal at first, but then you may proceed to your regular diet.  Drink plenty of fluids but you should avoid alcoholic beverages for 24 hours.  ACTIVITY:  You should plan to take it easy for the rest of today and you should NOT DRIVE or use heavy machinery until tomorrow (because of the sedation medicines used during the test).    FOLLOW UP: Our staff will call the number listed on your records 48-72 hours following your procedure to check on you and address any questions or concerns that you may have regarding the information given to you following your procedure. If we do not reach you, we will leave a message.  We will attempt to reach you two times.  During this call, we will ask if you have developed any symptoms of COVID 19. If you develop any symptoms (ie: fever, flu-like symptoms, shortness of breath, cough etc.) before  then, please call [phone].  If you test positive for Covid 19 in the 2 weeks post procedure, please call and report this information to [place].    If any biopsies were taken you will be contacted by phone or by letter within the next 1-3 weeks.  Please call us at [phone] if you have not heard about the biopsies in 3 weeks.    SIGNATURES/CONFIDENTIALITY: You and/or your care partner have signed paperwork which will be entered into your electronic medical record.  These signatures attest to the fact that that the information above on your After Visit Summary has been reviewed and is  understood.  Full responsibility of the confidentiality of this discharge information lies with you and/or your care-partner.

## 2020-05-05 ENCOUNTER — Encounter: Payer: Self-pay | Admitting: Gastroenterology

## 2020-05-05 ENCOUNTER — Telehealth: Payer: Self-pay

## 2020-05-05 NOTE — Telephone Encounter (Signed)
No answer on follow up call. Voicemail full.  

## 2020-05-05 NOTE — Telephone Encounter (Signed)
Second phone call attempt, no answer unable to LM

## 2020-05-06 ENCOUNTER — Other Ambulatory Visit: Payer: Self-pay

## 2020-05-06 DIAGNOSIS — A048 Other specified bacterial intestinal infections: Secondary | ICD-10-CM

## 2020-05-06 MED ORDER — DOXYCYCLINE HYCLATE 100 MG PO CAPS: 100.0000 mg | ORAL_CAPSULE | Freq: Two times a day (BID) | ORAL | 0 refills | Status: AC

## 2020-05-06 MED ORDER — BISMUTH SUBSALICYLATE 262 MG PO TABS: 524.0000 mg | ORAL_TABLET | Freq: Four times a day (QID) | ORAL | 0 refills | Status: AC

## 2020-05-06 MED ORDER — METRONIDAZOLE 250 MG PO TABS: 250.0000 mg | ORAL_TABLET | Freq: Two times a day (BID) | ORAL | 0 refills | Status: AC

## 2020-06-07 ENCOUNTER — Encounter: Payer: Self-pay | Admitting: Family

## 2020-06-08 ENCOUNTER — Ambulatory Visit: Payer: 59 | Admitting: Internal Medicine

## 2020-06-08 DIAGNOSIS — Z0289 Encounter for other administrative examinations: Secondary | ICD-10-CM

## 2020-06-08 NOTE — Telephone Encounter (Signed)
Spoke with pt and she stated that she is not having any active thoughts of hurting herself. Pt stated that she is fine and will be fine. Pt is scheduled for a virtual visit with Joycelyn Schmid on Friday. Pt was advised that if she developed any thoughts of hurting herself that she should call 911 or get to the ED. Pt gave a verbal understanding and stated again that she will be fine.

## 2020-06-08 NOTE — Telephone Encounter (Signed)
LMTCB

## 2020-06-08 NOTE — Progress Notes (Deleted)
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 She has followed with [facility] since 12/10/2019   for consultative assistance with management of her Left adrenal Myelolipoma  HISTORICAL SUMMARY:  Pt was noted to have a left adrenal myelolipoma on CT imagine during work up for right sided abdominal pain. This measured 10.3x7.8 cm in diameter.   No evidence of pheochromocytoma, cushing or hyperaldosteronemia were found.  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   HISTORY:  Past Medical History:  Past Medical History:  Diagnosis Date  . Anxiety   . Complication of anesthesia    WOKE UP DURING WISDOM TOOTH SURGERY  . Depression   . Deviated septum   . Fatty liver disease, nonalcoholic   . GERD (gastroesophageal reflux disease)   . Headache   . Hypertension    no meds taken    Past Surgical History:  Past Surgical History:  Procedure Laterality Date  . ADRENALECTOMY Left 01/24/2020   Procedure: Open Left ADRENALECTOMY;  Surgeon: Fredirick Maudlin, MD;  Location: [facility] ORS;  Service: General;  Laterality: Left;  . BREAST REDUCTION SURGERY    . NASAL SEPTUM SURGERY     X2  . TONSILLECTOMY    . WISDOM TOOTH EXTRACTION       Social History:  reports that she has never smoked. She has never used smokeless tobacco. She reports current alcohol use. She reports that she does not use drugs. Family History:  Family History  Problem Relation Age of Onset  . Heart attack Father   . Diabetes Sister   . Hyperlipidemia Sister   . Hypertension Sister   . Heart attack  Paternal Aunt   . Heart disease Paternal Aunt   . Heart attack Paternal Aunt   . Heart disease Paternal Aunt   . Heart attack Paternal Aunt   . Heart disease Paternal Aunt   . Colon cancer Neg Hx   . Esophageal cancer Neg Hx   . Pancreatic cancer Neg Hx   . Stomach cancer Neg Hx   . Inflammatory bowel disease Neg Hx   . Liver disease Neg Hx   . Rectal cancer Neg Hx       HOME MEDICATIONS: Allergies as of 06/08/2020   No Known Allergies     Medication List       Accurate as of June 08, 2020  7:39 AM. If you have any questions, ask your nurse or doctor.        esomeprazole 40 MG capsule Commonly known as: NexIUM Take 1 capsule (40 mg total) by mouth daily.   linaclotide 290 MCG Caps capsule Commonly known as: Linzess Take 1 capsule (290 mcg total) by mouth daily before breakfast.   metFORMIN 500 MG 24 hr tablet Commonly known as: GLUCOPHAGE-XR daily.         OBJECTIVE:   PHYSICAL EXAM: VS: There were no vitals taken for this visit.   EXAM: General: Pt appears well and is in NAD  Hydration: Well-hydrated with moist mucous membranes and  good skin turgor  Eyes: External eye exam normal without stare, lid lag or exophthalmos.  EOM intact.  PERRL.  Ears, Nose, Throat: Hearing: Grossly intact bilaterally Dental: Good dentition  Throat: Clear without mass, erythema or exudate  Neck: General: Supple without adenopathy. Thyroid: Thyroid size normal.  No goiter or nodules appreciated. No thyroid bruit.  Lungs: Clear with good BS bilat with no rales, rhonchi, or wheezes  Heart: Auscultation: RRR.  Abdomen: Normoactive bowel sounds, soft, nontender, without masses or organomegaly palpable  Extremities: Gait and station: Normal gait  Digits and nails: No clubbing, cyanosis, petechiae, or nodes Head and neck: Normal alignment and mobility BL UE: Normal ROM and strength. BL LE: No pretibial edema normal ROM and strength.  Skin: Hair: Texture and amount normal with  gender appropriate distribution Skin Inspection: No rashes, acanthosis nigricans/skin tags. No lipohypertrophy Skin Palpation: Skin temperature, texture, and thickness normal to palpation  Neuro: Cranial nerves: II - XII grossly intact  Cerebellar: Normal coordination and movement; no tremor Motor: Normal strength throughout DTRs: 2+ and symmetric in UE without delay in relaxation phase  Mental Status: Judgment, insight: Intact Orientation: Oriented to time, place, and person Memory: Intact for recent and remote events Mood and affect: No depression, anxiety, or agitation     DATA REVIEWED: ***  Pathology report 01/24/2020  A. 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  - ADJACENT UNREMARKABLE ADRENAL GLAND.  - NEGATIVE FOR MALIGNANCY.  ASSESSMENT / PLAN / RECOMMENDATIONS:   1. Left adrenal Myeolipoma   Plan:  ***    Medications   ***   Signed electronically by: Mack Guise, MD  [facility] Endocrinology  [facility] [address], [place], [place] [address] Phone: [phone] FAX: [phone]      CC: Burnard Hawthorne, FNP [address] [place] [address] Phone: [phone]  Fax: [phone]   Return to Endocrinology clinic as below: Future Appointments  Date Time Provider [place]  06/08/2020  9:10 AM Kensie Susman, Melanie Crazier, MD [facility] None  06/30/2020  3:30 PM Mansouraty, Telford Nab., MD [facility]

## 2020-06-12 ENCOUNTER — Telehealth (INDEPENDENT_AMBULATORY_CARE_PROVIDER_SITE_OTHER): Payer: 59 | Admitting: Internal Medicine

## 2020-06-12 ENCOUNTER — Encounter: Payer: Self-pay | Admitting: Internal Medicine

## 2020-06-12 VITALS — Ht 66.0 in | Wt 219.0 lb

## 2020-06-12 DIAGNOSIS — F439 Reaction to severe stress, unspecified: Secondary | ICD-10-CM

## 2020-06-12 DIAGNOSIS — F339 Major depressive disorder, recurrent, unspecified: Secondary | ICD-10-CM | POA: Insufficient documentation

## 2020-06-12 DIAGNOSIS — F5104 Psychophysiologic insomnia: Secondary | ICD-10-CM

## 2020-06-12 DIAGNOSIS — F32A Depression, unspecified: Secondary | ICD-10-CM

## 2020-06-12 DIAGNOSIS — F329 Major depressive disorder, single episode, unspecified: Secondary | ICD-10-CM

## 2020-06-12 DIAGNOSIS — F419 Anxiety disorder, unspecified: Secondary | ICD-10-CM

## 2020-06-12 NOTE — Progress Notes (Signed)
Telephone Note  I connected with Terri Cochran  on 06/12/20 at 12:00 AM EDT by telephone and verified that I am speaking with the correct person using two identifiers.  Location patient: home Location provider:work or home office Persons participating in the virtual visit: patient, provider  I discussed the limitations of evaluation and management by telemedicine and the availability of in person appointments. The patient expressed understanding and agreed to proceed.   HPI: Anxiety/depression/chronic insomnia/stress worse x 1 week due to ex husband divorced 02/2020 and now he has someone else he is dating new and treating this person better. Mood worse around her cycle on zoloft 50 mg qd and not helping even with higher doses. 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 And 06/11/20 could not calm down her mind  She also has the trigger of miscarriage she had 04/2019 which she is thinking about and causing mood changes and h/o OCD her taking pregnancy tests over and over in the past.    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  Past Medical History:  Diagnosis Date  . Anxiety   . Complication of anesthesia    WOKE UP DURING WISDOM TOOTH SURGERY  . Depression   . Deviated septum   . Fatty liver disease, nonalcoholic   . GERD (gastroesophageal reflux disease)   . Headache   . Hypertension    no meds taken    Past Surgical History:  Procedure Laterality Date  . ADRENALECTOMY Left 01/24/2020   Procedure:  Open Left ADRENALECTOMY;  Surgeon: Fredirick Maudlin, MD;  Location: [facility] ORS;  Service: General;  Laterality: Left;  . BREAST REDUCTION SURGERY    . NASAL SEPTUM SURGERY     X2  . TONSILLECTOMY    . WISDOM TOOTH EXTRACTION      Family History  Problem Relation Age of Onset  . Heart attack Father   . Diabetes Sister   . Hyperlipidemia Sister   . Hypertension Sister   . Heart attack Paternal Aunt   . Heart disease Paternal Aunt   . Heart attack Paternal Aunt   . Heart disease Paternal Aunt   . Heart attack Paternal Aunt   . Heart disease Paternal Aunt   . Colon cancer Neg Hx   . Esophageal cancer Neg Hx   . Pancreatic cancer Neg Hx   . Stomach cancer Neg Hx   . Inflammatory bowel disease Neg Hx   . Liver disease Neg Hx   . Rectal cancer Neg Hx     SOCIAL HX: divorced as of 02/2020   Current Outpatient Medications:  .  esomeprazole (NEXIUM) 40 MG capsule, Take 1 capsule (40 mg total) by mouth daily., Disp: 30 capsule, Rfl: 2 .  linaclotide (LINZESS) 290 MCG CAPS capsule, Take 1 capsule (290 mcg total) by mouth daily before breakfast., Disp: 30 capsule, Rfl: 2 .  metFORMIN (GLUCOPHAGE-XR) 500 MG 24 hr tablet, daily., Disp: ,  Rfl:  .  sertraline (ZOLOFT) 50 MG tablet, Take 50 mg by mouth daily., Disp: , Rfl:   EXAM:  VITALS per patient if applicable:  GENERAL: alert, oriented, appears well and in no acute distress   PSYCH/NEURO: pleasant and cooperative, no obvious depression or anxiety, speech and thought processing grossly intact  ASSESSMENT AND PLAN:  Discussed the following assessment and plan:  Anxiety and depression  Depression, recurrent (HCC)  Chronic insomnia  Stress On zoloft 50 mg qd does not want to change dose further med changes via psych  Will contact [facility] for online therapy resources needs therapist with [place] lives in [place]  -we discussed possible serious and likely etiologies, options for evaluation and workup, limitations of  telemedicine visit vs in person visit, treatment, treatment risks and precautions. Pt prefers to treat via telemedicine empirically rather then risking or undertaking an in person visit at this moment. Patient agrees to seek prompt in person care if worsening, new symptoms arise, or if is not improving with treatment.   I discussed the assessment and treatment plan with the patient. The patient was provided an opportunity to ask questions and all were answered. The patient agreed with the plan and demonstrated an understanding of the instructions.   The patient was advised to call back or seek an in-person evaluation if the symptoms worsen or if the condition fails to improve as anticipated.  Time spent 20 min Delorise Jackson, MD

## 2020-06-12 NOTE — Progress Notes (Signed)
Patient presenting for increased depression and anxiety. She used to have counseling and is not opposed to starting this again.

## 2020-06-23 ENCOUNTER — Encounter: Payer: 59 | Admitting: Gastroenterology

## 2020-06-30 ENCOUNTER — Ambulatory Visit: Payer: 59 | Admitting: Gastroenterology

## 2020-07-23 ENCOUNTER — Encounter: Payer: Self-pay | Admitting: Family

## 2020-07-29 NOTE — Telephone Encounter (Signed)
Fyi  Patient has read mychart message Will wait for her to respond

## 2020-08-31 IMAGING — CT CT ABD-PELV W/ CM
2 of 5 series · 16 of 46 positions shown, 18 images · IV contrast (OMNIPAQUE)
Comparison: MR abdomen 11/23/2019 and CT abdomen pelvis 11/03/2019.

CLINICAL DATA: Chronic constipation, nausea, vomiting, hepatic
steatosis.

EXAM:
CT ABDOMEN AND PELVIS WITH CONTRAST
TECHNIQUE: Multidetector CT imaging of the abdomen and pelvis was performed
using the standard protocol following bolus administration of
intravenous contrast.
CONTRAST:  100mL OMNIPAQUE IOHEXOL 300 MG/ML  SOLN

[Series 2: axial st · axial · 0.79mm/px · z∈[-681,-261]mm · 13 of 98 slices shown, 15 images]
[im 7/98  soft-tissue]
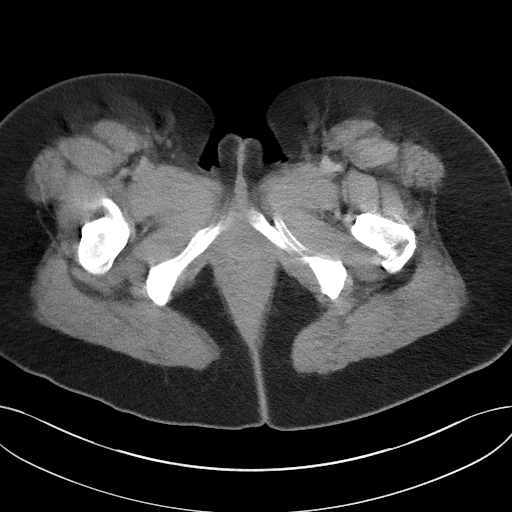
[im 7/98  bone]
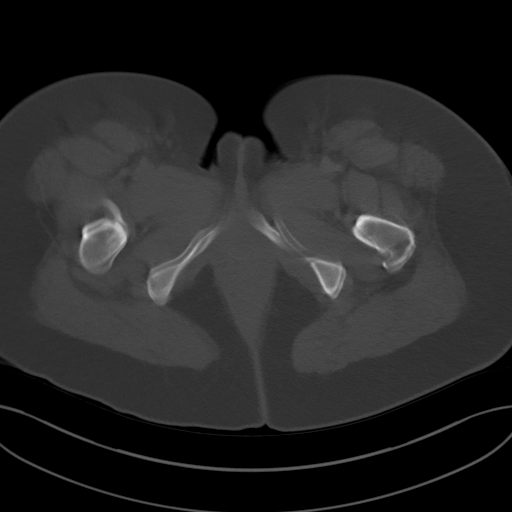
[im 13/98  soft-tissue]
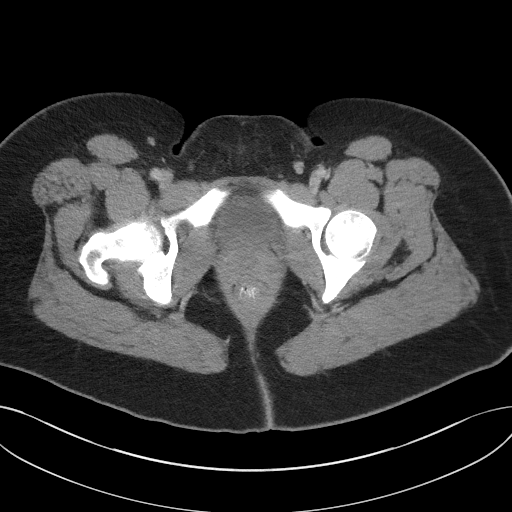
[im 20/98  soft-tissue]
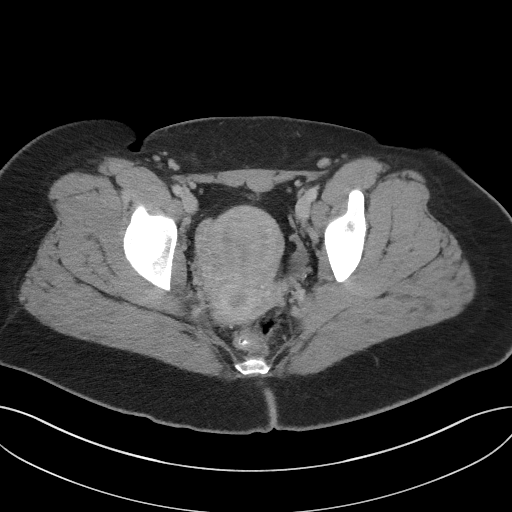
[im 26/98  soft-tissue]
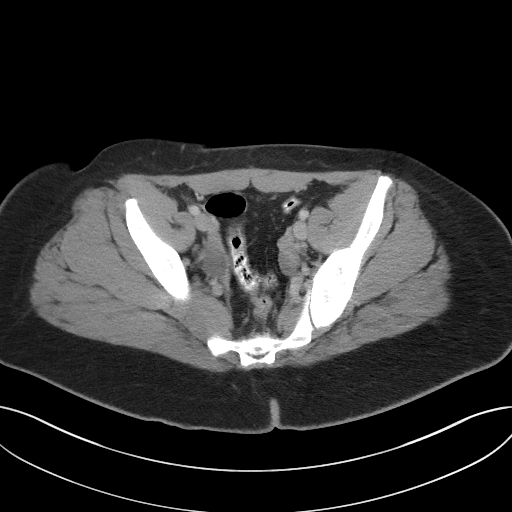
[im 33/98  soft-tissue]
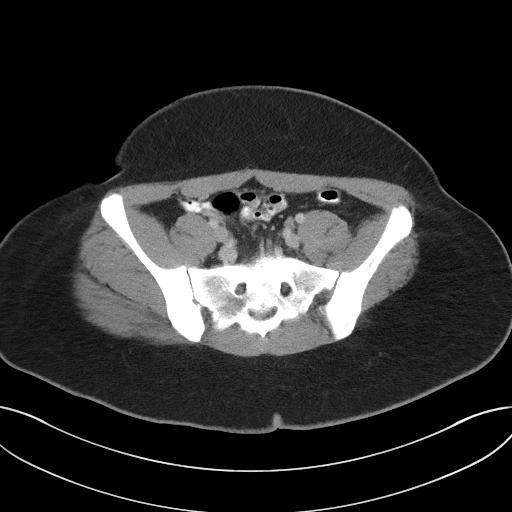
[im 39/98  soft-tissue]
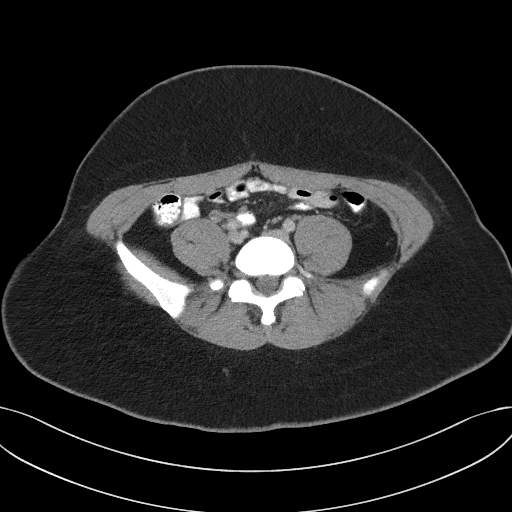
[im 52/98  soft-tissue]
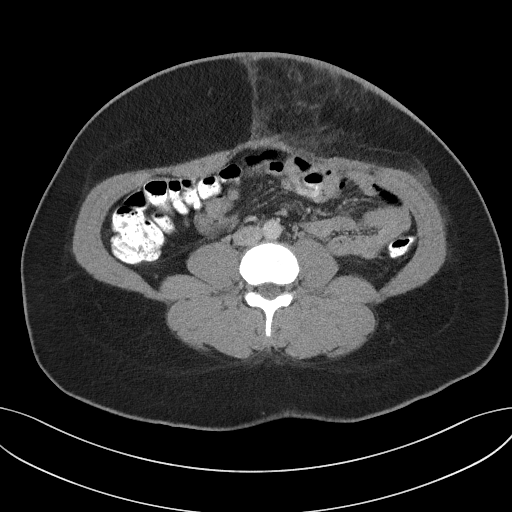
[im 59/98  soft-tissue]
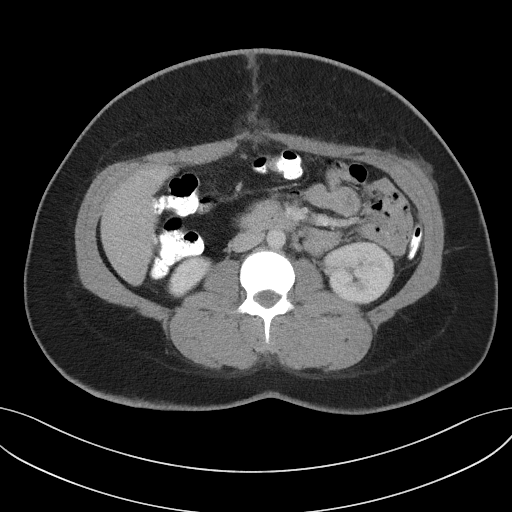
[im 65/98  soft-tissue]
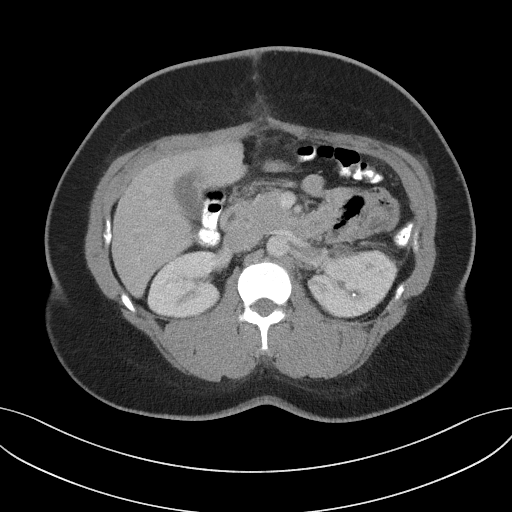
[im 65/98  bone]
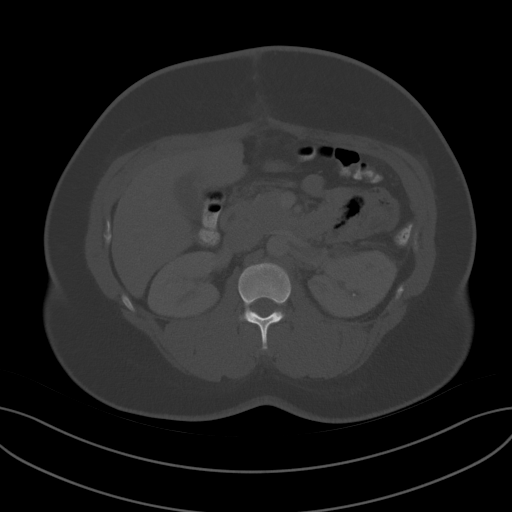
[im 72/98  soft-tissue]
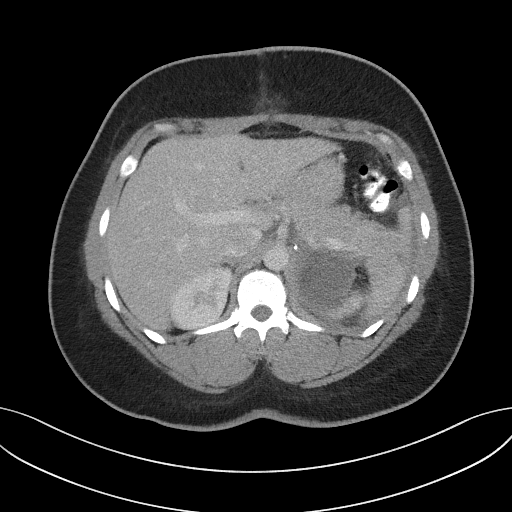
[im 78/98  soft-tissue]
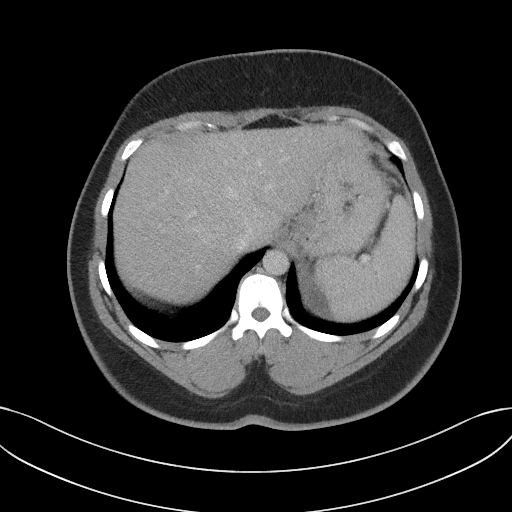
[im 85/98  soft-tissue]
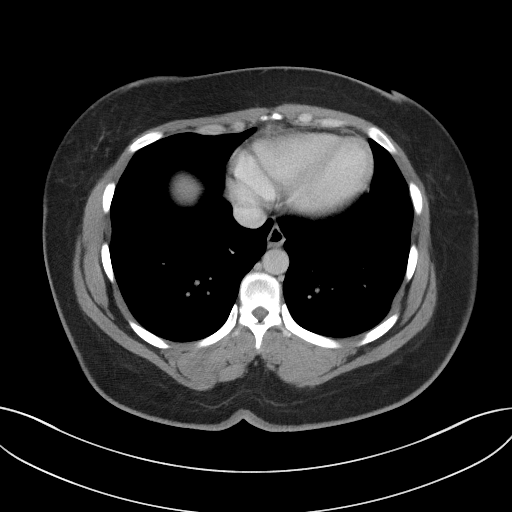
[im 91/98  soft-tissue]
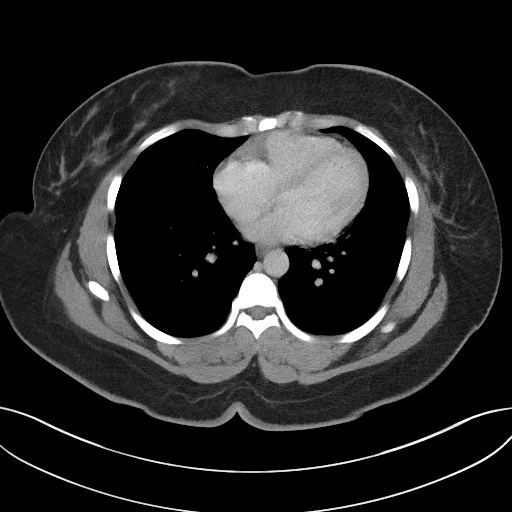

[Series 5: coronal st · coronal · 0.77mm/px · 3 of 118 slices shown]
[im 40/118  soft-tissue]
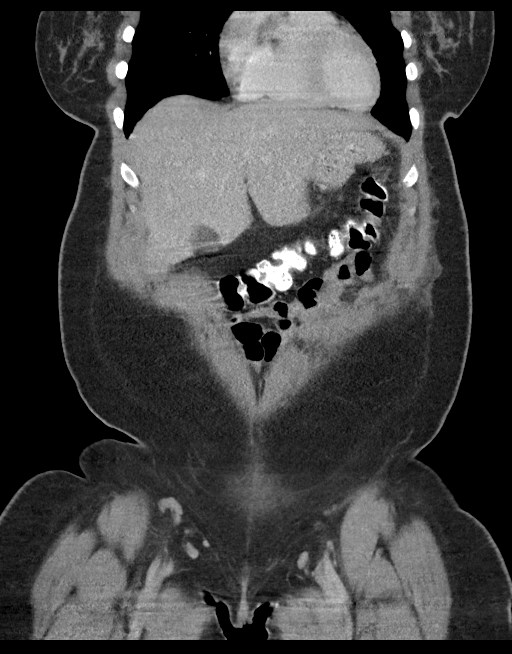
[im 53/118  soft-tissue]
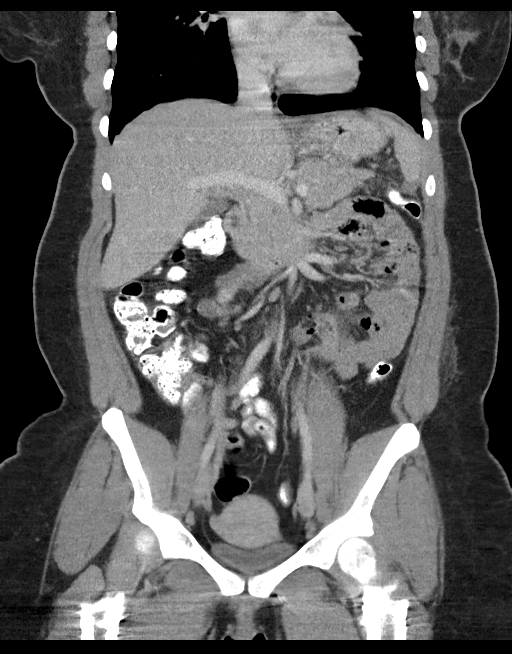
[im 66/118  soft-tissue]
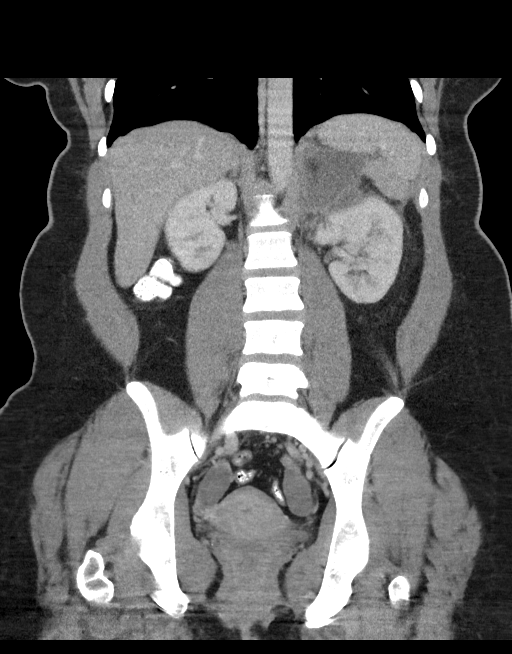

[16 of 46 positions shown; findings below may reference images not displayed]

FINDINGS: Lower chest: Lung bases are clear. Heart size normal. No pericardial
or pleural effusion. Distal esophagus is unremarkable.

Hepatobiliary: Subcentimeter low-attenuation lesion in the inferior
right hepatic lobe is unchanged and likely a cyst or hemangioma.
Liver and gallbladder are otherwise unremarkable. No biliary ductal
dilatation.

Pancreas: Negative.

Spleen: Negative.

Adrenals/Urinary Tract: Right adrenal gland is unremarkable. Left
adrenalectomy. Residual fluid collection in the surgical bed
measures approximately 3.7 x 5.6 cm ([DATE]) and extends into the left
pararenal space. Right kidney is unremarkable. A small stone is seen
in the left kidney. Ureters are decompressed. Bladder is low in
volume.

Stomach/Bowel: Stomach appears thick-walled but is under distended.
Small bowel, appendix and colon are otherwise unremarkable.

Vascular/Lymphatic: Vascular structures are unremarkable. No
pathologically enlarged lymph nodes.

Reproductive: Uterus is visualized.  No adnexal mass.

Other: No pelvic free fluid. Mesenteries and peritoneum are
otherwise unremarkable. Postoperative changes are seen along the
midline ventral and left lateral abdominal wall.

Musculoskeletal: None.
IMPRESSION: 1. Left adrenalectomy with a residual fluid collection in the
surgical bed, which may represent a seroma. Difficult to
definitively exclude an abscess. Please correlate clinically.
2. Small left renal stone.

## 2020-09-14 ENCOUNTER — Ambulatory Visit (INDEPENDENT_AMBULATORY_CARE_PROVIDER_SITE_OTHER): Payer: 59

## 2020-09-14 ENCOUNTER — Other Ambulatory Visit (HOSPITAL_COMMUNITY)
Admission: RE | Admit: 2020-09-14 | Discharge: 2020-09-14 | Disposition: A | Discharge status: Home / Self Care | Payer: 59 | Source: Ambulatory Visit | Attending: Family | Admitting: Family

## 2020-09-14 ENCOUNTER — Encounter: Payer: Self-pay | Admitting: Family

## 2020-09-14 ENCOUNTER — Other Ambulatory Visit: Payer: Self-pay

## 2020-09-14 ENCOUNTER — Telehealth (INDEPENDENT_AMBULATORY_CARE_PROVIDER_SITE_OTHER): Payer: 59 | Admitting: Family

## 2020-09-14 VITALS — BP 116/82 | HR 85 | Temp 98.4°F | Ht 66.0 in | Wt 233.4 lb

## 2020-09-14 DIAGNOSIS — M25512 Pain in left shoulder: Secondary | ICD-10-CM | POA: Insufficient documentation

## 2020-09-14 DIAGNOSIS — F32A Depression, unspecified: Secondary | ICD-10-CM

## 2020-09-14 DIAGNOSIS — F419 Anxiety disorder, unspecified: Secondary | ICD-10-CM | POA: Diagnosis not present

## 2020-09-14 DIAGNOSIS — Z113 Encounter for screening for infections with a predominantly sexual mode of transmission: Secondary | ICD-10-CM | POA: Diagnosis not present

## 2020-09-14 DIAGNOSIS — R42 Dizziness and giddiness: Secondary | ICD-10-CM

## 2020-09-14 MED ORDER — VENLAFAXINE HCL ER 75 MG PO CP24: 75.0000 mg | ORAL_CAPSULE | Freq: Every day | ORAL | 0 refills | Status: DC

## 2020-09-14 NOTE — Patient Instructions (Signed)
Start effexor for headache , anxiety  Drink more water and be careful with position changes.  Referral to counseling Let us know if you dont hear back within a week in regards to an appointment being scheduled.   Let me know if left arm doesn't improve with icing, rest. If not, I would advise we consult orthopedics.   Orthostatic Hypotension Blood pressure is a measurement of how strongly, or weakly, your blood is pressing against the walls of your arteries. Orthostatic hypotension is a sudden drop in blood pressure that happens when you quickly change positions, such as when you get up from sitting or lying down. Arteries are blood vessels that carry blood from your heart throughout your body. When blood pressure is too low, you may not get enough blood to your brain or to the rest of your organs. This can cause weakness, light-headedness, rapid heartbeat, and fainting. This can last for just a few seconds or for up to a few minutes. Orthostatic hypotension is usually not a serious problem. However, if it happens frequently or gets worse, it may be a sign of something more serious. What are the causes? This condition may be caused by:  Sudden changes in posture, such as standing up quickly after you have been sitting or lying down.  Blood loss.  Loss of body fluids (dehydration).  Heart problems.  Hormone (endocrine) problems.  Pregnancy.  Severe infection.  Lack of certain nutrients.  Severe allergic reactions (anaphylaxis).  Certain medicines, such as blood pressure medicine or medicines that make the body lose excess fluids (diuretics). Sometimes, this condition can be caused by not taking medicine as directed, such as taking too much of a certain medicine. What increases the risk? The following factors may make you more likely to develop this condition:  Age. Risk increases as you get older.  Conditions that affect the heart or the central nervous system.  Taking  certain medicines, such as blood pressure medicine or diuretics.  Being pregnant. What are the signs or symptoms? Symptoms of this condition may include:  Weakness.  Light-headedness.  Dizziness.  Blurred vision.  Fatigue.  Rapid heartbeat.  Fainting, in severe cases. How is this diagnosed? This condition is diagnosed based on:  Your medical history.  Your symptoms.  Your blood pressure measurement. Your health care provider will check your blood pressure when you are: ? Lying down. ? Sitting. ? Standing. A blood pressure reading is recorded as two numbers, such as "120 over 80" (or 120/80). The first ("top") number is called the systolic pressure. It is a measure of the pressure in your arteries as your heart beats. The second ("bottom") number is called the diastolic pressure. It is a measure of the pressure in your arteries when your heart relaxes between beats. Blood pressure is measured in a unit called mm Hg. Healthy blood pressure for most adults is 120/80. If your blood pressure is below 90/60, you may be diagnosed with hypotension. Other information or tests that may be used to diagnose orthostatic hypotension include:  Your other vital signs, such as your heart rate and temperature.  Blood tests.  Tilt table test. For this test, you will be safely secured to a table that moves you from a lying position to an upright position. Your heart rhythm and blood pressure will be monitored during the test. How is this treated? This condition may be treated by:  Changing your diet. This may involve eating more salt (sodium) or drinking more water.  Taking medicines to raise your blood pressure.  Changing the dosage of certain medicines you are taking that might be lowering your blood pressure.  Wearing compression stockings. These stockings help to prevent blood clots and reduce swelling in your legs. In some cases, you may need to go to the hospital for:  Fluid  replacement. This means you will receive fluids through an IV.  Blood replacement. This means you will receive donated blood through an IV (transfusion).  Treating an infection or heart problems, if this applies.  Monitoring. You may need to be monitored while medicines that you are taking wear off. Follow these instructions at home: Eating and drinking   Drink enough fluid to keep your urine pale yellow.  Eat a healthy diet, and follow instructions from your health care provider about eating or drinking restrictions. A healthy diet includes: ? Fresh fruits and vegetables. ? Whole grains. ? Lean meats. ? Low-fat dairy products.  Eat extra salt only as directed. Do not add extra salt to your diet unless your health care provider told you to do that.  Eat frequent, small meals.  Avoid standing up suddenly after eating. Medicines  Take over-the-counter and prescription medicines only as told by your health care provider. ? Follow instructions from your health care provider about changing the dosage of your current medicines, if this applies. ? Do not stop or adjust any of your medicines on your own. General instructions   Wear compression stockings as told by your health care provider.  Get up slowly from lying down or sitting positions. This gives your blood pressure a chance to adjust.  Avoid hot showers and excessive heat as directed by your health care provider.  Return to your normal activities as told by your health care provider. Ask your health care provider what activities are safe for you.  Do not use any products that contain nicotine or tobacco, such as cigarettes, e-cigarettes, and chewing tobacco. If you need help quitting, ask your health care provider.  Keep all follow-up visits as told by your health care provider. This is important. Contact a health care provider if you:  Vomit.  Have diarrhea.  Have a fever for more than 2-3 days.  Feel more thirsty  than usual.  Feel weak and tired. Get help right away if you:  Have chest pain.  Have a fast or irregular heartbeat.  Develop numbness in any part of your body.  Cannot move your arms or your legs.  Have trouble speaking.  Become sweaty or feel light-headed.  Faint.  Feel short of breath.  Have trouble staying awake.  Feel confused. Summary  Orthostatic hypotension is a sudden drop in blood pressure that happens when you quickly change positions.  Orthostatic hypotension is usually not a serious problem.  It is diagnosed by having your blood pressure taken lying down, sitting, and then standing.  It may be treated by changing your diet or adjusting your medicines. This information is not intended to replace advice given to you by your health care provider. Make sure you discuss any questions you have with your health care provider. Document Revised: 05/03/2018 Document Reviewed: 05/03/2018 Elsevier Patient Education  [place].

## 2020-09-14 NOTE — Assessment & Plan Note (Signed)
Uncontrolled. Anxiety and insomnia most bothersome for patient. Placed referral for counseling to address longstanding fear of the dark. Will trial effexor 75mg  as zoloft hadnt been effective and in setting of migraine history

## 2020-09-14 NOTE — Progress Notes (Signed)
Subjective:    Patient ID: Terri Cochran, female    DOB: 08-18-1991, 29 y.o.   MRN: [id number]  CC: Terri Cochran is a 29 y.o. female who presents today for an acute visit.    HPI: Multiple complaints  Complains of depression, anxiety and trouble falling asleep. Chronic insomnia from childhood.  She describes being 'terrified of dark' from belief system from [place] when dark was approaching. Slept with sister until she was 37 years old. On average sleeps 3 hours. Some nights may not sleep and will wake up easily.   No longer on  zoloft 50mg  as not effective. No si/hi. Excited about a trip to [place] for family.   Has tried zquil, nightquil, 'everything over the counter' with temporary relief.  Never been diagnosed with bipolar.   Denies a period of having more energy than usual, didn't require sleep, spending more money than usual and got into trouble, a time when so hyper got into trouble, more irritated that you started arguments.  Denies that these acts caused trouble at work, financially, or with family or personal relationships.    Complains of dizziness x one month, comes and goes.  Triggered as moves from sitting to standing. 3 days ago while walking during the evening she lost her balance and feel into a parked car on her left shoulder. Since then she has pain with lateral raises. No numbness in left arm or swelling. No laceration, bruising  No vertigo, syncopal episode. Sleep is worsening dizziness.   H/o of migraines since young child. Presents behind left eye. Endorses nausea, photophobia. No vision loss, numbness in face.    Would like STD testing as had 2 new partners. No purulent discharge or lesions.  Currently on birth control. LMP: 9/ 23/21.    HISTORY:  Past Medical History:  Diagnosis Date  . Anxiety   . Complication of anesthesia    WOKE UP DURING WISDOM TOOTH SURGERY  . Depression   . Deviated septum   . Fatty liver disease, nonalcoholic   . GERD  (gastroesophageal reflux disease)   . Headache   . Hypertension    no meds taken   Past Surgical History:  Procedure Laterality Date  . ADRENALECTOMY Left 01/24/2020   Procedure: Open Left ADRENALECTOMY;  Surgeon: Fredirick Maudlin, MD;  Location: [facility] ORS;  Service: General;  Laterality: Left;  . BREAST REDUCTION SURGERY    . NASAL SEPTUM SURGERY     X2  . TONSILLECTOMY    . WISDOM TOOTH EXTRACTION     Family History  Problem Relation Age of Onset  . Heart attack Father   . Diabetes Sister   . Hyperlipidemia Sister   . Hypertension Sister   . Heart attack Paternal Aunt   . Heart disease Paternal Aunt   . Heart attack Paternal Aunt   . Heart disease Paternal Aunt   . Heart attack Paternal Aunt   . Heart disease Paternal Aunt   . Colon cancer Neg Hx   . Esophageal cancer Neg Hx   . Pancreatic cancer Neg Hx   . Stomach cancer Neg Hx   . Inflammatory bowel disease Neg Hx   . Liver disease Neg Hx   . Rectal cancer Neg Hx     Allergies: Patient has no known allergies. Current Outpatient Medications on File Prior to Visit  Medication Sig Dispense Refill  . linaclotide (LINZESS) 290 MCG CAPS capsule Take 1 capsule (290 mcg total) by mouth daily before breakfast.  30 capsule 2  . norgestimate-ethinyl estradiol (SPRINTEC 28) 0.25-35 MG-MCG tablet Take 1 tablet by mouth daily.    . metFORMIN (GLUCOPHAGE-XR) 500 MG 24 hr tablet daily. (Patient not taking: Reported on 09/14/2020)     No current facility-administered medications on file prior to visit.    Social History   Tobacco Use  . Smoking status: Never Smoker  . Smokeless tobacco: Never Used  Vaping Use  . Vaping Use: Never used  Substance Use Topics  . Alcohol use: Yes    Comment: occasionally  . Drug use: Never    Review of Systems  Constitutional: Negative for chills and fever.  Eyes: Negative for visual disturbance.  Respiratory: Negative for cough.   Cardiovascular: Negative for chest pain and palpitations.    Gastrointestinal: Negative for nausea and vomiting.  Genitourinary: Negative for vaginal discharge and vaginal pain.  Musculoskeletal: Positive for arthralgias (left shoulder).  Neurological: Positive for dizziness and headaches. Negative for numbness.  Psychiatric/Behavioral: Positive for sleep disturbance. Negative for suicidal ideas. The patient is nervous/anxious.       Objective:    BP 116/82   Pulse 85   Temp 98.4 F (36.9 C)   Ht 5\' 6"  (1.676 m)   Wt 233 lb 6.4 oz (105.9 kg)   SpO2 98%   BMI 37.67 kg/m    Physical Exam Vitals reviewed.  Constitutional:      Appearance: She is well-developed.  HENT:     Mouth/Throat:     Pharynx: Uvula midline.  Eyes:     Conjunctiva/sclera: Conjunctivae normal.     Pupils: Pupils are equal, round, and reactive to light.     Comments: Fundus normal bilaterally.   Cardiovascular:     Rate and Rhythm: Normal rate and regular rhythm.     Pulses: Normal pulses.     Heart sounds: Normal heart sounds.  Pulmonary:     Effort: Pulmonary effort is normal.     Breath sounds: Normal breath sounds. No wheezing, rhonchi or rales.  Musculoskeletal:     Right shoulder: Normal. No swelling.     Left shoulder: Tenderness present. No swelling or bony tenderness. Decreased range of motion.     Comments: Left Shoulder:   No asymmetry of shoulders when comparing right and left.Pain appreciated over palpation over glenohumeral joint lines. No pain over [place] joint, AC joint.  pain with internal and external rotation as well as  lateral extension . Decreased lateral extension due to pain.    Strength and sensation normal BUE's.   Skin:    General: Skin is warm and dry.  Neurological:     Mental Status: She is alert.     Cranial Nerves: No cranial nerve deficit.     Sensory: No sensory deficit.     Deep Tendon Reflexes:     Reflex Scores:      Bicep reflexes are 2+ on the right side and 2+ on the left side.      Patellar reflexes are 2+ on the  right side and 2+ on the left side.    Comments: Grip equal and strong bilateral upper extremities. Gait strong and steady. Able to perform rapid alternating movement without difficulty.   Psychiatric:        Speech: Speech normal.        Behavior: Behavior normal.        Thought Content: Thought content normal.        Assessment & Plan:   Problem List Items  Addressed This Visit      Other   Anxiety and depression - Primary    Uncontrolled. Anxiety and insomnia most bothersome for patient. Placed referral for counseling to address longstanding fear of the dark. Will trial effexor 75mg  as zoloft hadnt been effective and in setting of migraine history      Relevant Medications   venlafaxine XR (EFFEXOR XR) 75 MG 24 hr capsule   Other Relevant Orders   Ambulatory referral to Psychology   Dizziness    New. Orthostatic per vitals today ( see flow sheet). Advised to drink more water and to be careful with position changes.       Left shoulder pain    Acute. Pending XR. Advised diligent icing therapy and let me know if pain, rom didn't approve with rest, icing therapy.      Relevant Orders   DG Shoulder Left   Screening for STD (sexually transmitted disease)    Asymptomatic. Screening labs ordered as requested.       Relevant Orders   HSV 1/2 Ab (IgM), IFA w/rflx Titer   RPR   HIV Antibody (routine testing w rflx)   Acute Hep Panel & Hep B Surface Ab   Urine cytology ancillary only([place])        I have discontinued Sosie J. Smith's esomeprazole and sertraline. I am also having her start on venlafaxine XR. Additionally, I am having her maintain her metFORMIN, linaclotide, and norgestimate-ethinyl estradiol.   Meds ordered this encounter  Medications  . venlafaxine XR (EFFEXOR XR) 75 MG 24 hr capsule    Sig: Take 1 capsule (75 mg total) by mouth daily with breakfast.    Dispense:  90 capsule    Refill:  0    Order Specific Question:   Supervising Provider     Answer:   Crecencio Mc [[id number]]    Return precautions given.   Risks, benefits, and alternatives of the medications and treatment plan prescribed today were discussed, and patient expressed understanding.   Education regarding symptom management and diagnosis given to patient on AVS.  Continue to follow with Burnard Hawthorne, FNP for routine health maintenance.   Aline Brochure and I agreed with plan.   Mable Paris, FNP

## 2020-09-14 NOTE — Assessment & Plan Note (Signed)
Acute. Pending XR. Advised diligent icing therapy and let me know if pain, rom didn't approve with rest, icing therapy.

## 2020-09-14 NOTE — Assessment & Plan Note (Signed)
Asymptomatic. Screening labs ordered as requested.

## 2020-09-14 NOTE — Assessment & Plan Note (Addendum)
New. Orthostatic per vitals today ( see flow sheet). Advised to drink more water and to be careful with position changes.

## 2020-09-15 LAB — URINE CYTOLOGY ANCILLARY ONLY
Bacterial Vaginitis-Urine: NEGATIVE
Candida Urine: NEGATIVE
Chlamydia: NEGATIVE
Comment: NEGATIVE
Comment: NEGATIVE
Comment: NORMAL
Neisseria Gonorrhea: NEGATIVE
Trichomonas: NEGATIVE

## 2020-09-16 LAB — ACUTE HEP PANEL AND HEP B SURFACE AB
HEPATITIS C ANTIBODY REFILL$(REFL): NONREACTIVE
Hep A IgM: NONREACTIVE
Hep B C IgM: NONREACTIVE
Hepatitis B Surface Ag: NONREACTIVE
SIGNAL TO CUT-OFF: 0.03 (ref <1.00)

## 2020-09-16 LAB — RPR: RPR Ser Ql: NONREACTIVE

## 2020-09-16 LAB — REFLEX TIQ

## 2020-09-16 LAB — HIV ANTIBODY (ROUTINE TESTING W REFLEX): HIV 1&2 Ab, 4th Generation: NONREACTIVE

## 2020-09-18 ENCOUNTER — Encounter: Payer: Self-pay | Admitting: Family

## 2020-09-18 LAB — HSV 1/2 AB (IGM), IFA W/RFLX TITER
HSV 1 IgM Screen: NEGATIVE
HSV 2 IgM Screen: NEGATIVE

## 2020-10-23 ENCOUNTER — Encounter: Payer: Self-pay | Admitting: Family

## 2020-10-27 ENCOUNTER — Ambulatory Visit: Payer: 59 | Admitting: Family

## 2020-11-18 ENCOUNTER — Encounter: Payer: Self-pay | Admitting: Family

## 2020-11-30 ENCOUNTER — Ambulatory Visit (INDEPENDENT_AMBULATORY_CARE_PROVIDER_SITE_OTHER): Payer: Self-pay

## 2020-11-30 ENCOUNTER — Encounter: Payer: Self-pay | Admitting: Family

## 2020-11-30 ENCOUNTER — Ambulatory Visit (INDEPENDENT_AMBULATORY_CARE_PROVIDER_SITE_OTHER): Payer: 59 | Admitting: Family

## 2020-11-30 ENCOUNTER — Other Ambulatory Visit: Payer: Self-pay

## 2020-11-30 VITALS — BP 118/82 | HR 89 | Temp 98.2°F | Ht 66.0 in | Wt 226.6 lb

## 2020-11-30 DIAGNOSIS — I2699 Other pulmonary embolism without acute cor pulmonale: Secondary | ICD-10-CM

## 2020-11-30 LAB — COMPREHENSIVE METABOLIC PANEL
ALT: 30 U/L (ref 0–35)
AST: 16 U/L (ref 0–37)
Albumin: 4.1 g/dL (ref 3.5–5.2)
Alkaline Phosphatase: 50 U/L (ref 39–117)
BUN: 14 mg/dL (ref 6–23)
CO2: 24 mEq/L (ref 19–32)
Calcium: 9.3 mg/dL (ref 8.4–10.5)
Chloride: 104 mEq/L (ref 96–112)
Creatinine, Ser: 0.84 mg/dL (ref 0.40–1.20)
GFR: 92.86 mL/min (ref >60.00)
Glucose, Bld: 117 mg/dL — ABNORMAL HIGH (ref 70–99)
Potassium: 4.4 mEq/L (ref 3.5–5.1)
Sodium: 137 mEq/L (ref 135–145)
Total Bilirubin: 0.3 mg/dL (ref 0.2–1.2)
Total Protein: 7.3 g/dL (ref 6.0–8.3)

## 2020-11-30 LAB — CBC WITH DIFFERENTIAL/PLATELET
Basophils Absolute: 0 10*3/uL (ref 0.0–0.1)
Basophils Relative: 0.7 % (ref 0.0–3.0)
Eosinophils Absolute: 0.1 10*3/uL (ref 0.0–0.7)
Eosinophils Relative: 2.3 % (ref 0.0–5.0)
HCT: 37.1 % (ref 36.0–46.0)
Hemoglobin: 12.1 g/dL (ref 12.0–15.0)
Lymphocytes Relative: 30 % (ref 12.0–46.0)
Lymphs Abs: 1.7 10*3/uL (ref 0.7–4.0)
MCHC: 32.6 g/dL (ref 30.0–36.0)
MCV: 78.9 fl (ref 78.0–100.0)
Monocytes Absolute: 0.3 10*3/uL (ref 0.1–1.0)
Monocytes Relative: 5.5 % (ref 3.0–12.0)
Neutro Abs: 3.5 10*3/uL (ref 1.4–7.7)
Neutrophils Relative %: 61.5 % (ref 43.0–77.0)
Platelets: 501 10*3/uL — ABNORMAL HIGH (ref 150.0–400.0)
RBC: 4.7 Mil/uL (ref 3.87–5.11)
RDW: 13.8 % (ref 11.5–15.5)
WBC: 5.8 10*3/uL (ref 4.0–10.5)

## 2020-11-30 LAB — HEMOGLOBIN A1C: Hgb A1c MFr Bld: 6.4 % (ref 4.6–6.5)

## 2020-11-30 LAB — TSH: TSH: 0.31 u[IU]/mL — ABNORMAL LOW (ref 0.35–4.50)

## 2020-11-30 LAB — VITAMIN D 25 HYDROXY (VIT D DEFICIENCY, FRACTURES): VITD: 11.11 ng/mL — ABNORMAL LOW (ref 30.00–100.00)

## 2020-11-30 MED ORDER — ELIQUIS 5 MG PO TABS: 5.0000 mg | ORAL_TABLET | Freq: Two times a day (BID) | ORAL | 2 refills | Status: DC

## 2020-11-30 MED ORDER — ALBUTEROL SULFATE HFA 108 (90 BASE) MCG/ACT IN AERS: 2.0000 | INHALATION_SPRAY | Freq: Four times a day (QID) | RESPIRATORY_TRACT | 0 refills | Status: DC | PRN

## 2020-11-30 NOTE — Progress Notes (Signed)
Subjective:    Patient ID: Terri Cochran, female    DOB: 1991/03/14, 30 y.o.   MRN: [id number]  CC: Terri Cochran is a 30 y.o. female who presents today for follow up.   HPI: Follow up PE  Flew to [facility] 10/27/20 to take care of sister's children while she was away.   13 days later developed sob which was associated with cough, body aches, nausea, diarrhea.Productive cough with blood in sputum.  No fever, chills, changes in taste /smell, leg swelling. Children whom she was taking care of also had diarrhea and nausea , 'a bug'.   Presented ED in [facility] 11/15/20. Unable to see records.   Per patient, negative flu, rsv, covid. CT chest showed bilateral PEs.   Patient thinks that PE is related to covid in early December.  Compliant with eliquis 5mg  BID. She completed 10mg  BID eliquis for 10 days. Continues to have SOB with talking and walking short distances.   Flies often and goes to [employer].   Compliant with ortho cyclen and started 8 weeks ago to regulate cycles.  No longer on effexor. No thoughts of hurting herself or anyone else.   No personal or family h/o bleeding/ clotting disorder. Non smoker.      HISTORY:  Past Medical History:  Diagnosis Date  . Anxiety   . Complication of anesthesia    WOKE UP DURING WISDOM TOOTH SURGERY  . Depression   . Deviated septum   . Fatty liver disease, nonalcoholic   . GERD (gastroesophageal reflux disease)   . Headache   . Hypertension    no meds taken   Past Surgical History:  Procedure Laterality Date  . ADRENALECTOMY Left 01/24/2020   Procedure: Open Left ADRENALECTOMY;  Surgeon: Fredirick Maudlin, MD;  Location: [facility] ORS;  Service: General;  Laterality: Left;  . BREAST REDUCTION SURGERY    . NASAL SEPTUM SURGERY     X2  . TONSILLECTOMY    . WISDOM TOOTH EXTRACTION     Family History  Problem Relation Age of Onset  . Heart attack Father   . Diabetes Sister   . Hyperlipidemia Sister   . Hypertension Sister   .  Heart attack Paternal Aunt   . Heart disease Paternal Aunt   . Heart attack Paternal Aunt   . Heart disease Paternal Aunt   . Heart attack Paternal Aunt   . Heart disease Paternal Aunt   . Colon cancer Neg Hx   . Esophageal cancer Neg Hx   . Pancreatic cancer Neg Hx   . Stomach cancer Neg Hx   . Inflammatory bowel disease Neg Hx   . Liver disease Neg Hx   . Rectal cancer Neg Hx   . Clotting disorder Neg Hx   . Bleeding Disorder Neg Hx     Allergies: Patient has no known allergies. Current Outpatient Medications on File Prior to Visit  Medication Sig Dispense Refill  . linaclotide (LINZESS) 290 MCG CAPS capsule Take 1 capsule (290 mcg total) by mouth daily before breakfast. 30 capsule 2   No current facility-administered medications on file prior to visit.    Social History   Tobacco Use  . Smoking status: Never Smoker  . Smokeless tobacco: Never Used  Vaping Use  . Vaping Use: Never used  Substance Use Topics  . Alcohol use: Yes    Comment: occasionally  . Drug use: Never    Review of Systems  Constitutional: Negative for chills, fever and  unexpected weight change.  HENT: Negative for congestion.   Respiratory: Positive for cough and shortness of breath. Negative for chest tightness.   Cardiovascular: Negative for chest pain, palpitations and leg swelling.  Gastrointestinal: Negative for nausea and vomiting.  Musculoskeletal: Negative for arthralgias and myalgias.  Skin: Negative for rash.  Neurological: Negative for headaches.  Hematological: Negative for adenopathy.  Psychiatric/Behavioral: Negative for confusion and suicidal ideas.      Objective:    BP 118/82   Pulse 89   Temp 98.2 F (36.8 C)   Ht 5\' 6"  (1.676 m)   Wt 226 lb 9.6 oz (102.8 kg)   SpO2 98%   BMI 36.57 kg/m  BP Readings from Last 3 Encounters:  11/30/20 118/82  09/14/20 116/82  05/01/20 106/64   Wt Readings from Last 3 Encounters:  11/30/20 226 lb 9.6 oz (102.8 kg)  09/14/20 233  lb 6.4 oz (105.9 kg)  06/12/20 (!) 219 lb (99.3 kg)    Physical Exam Vitals reviewed.  Constitutional:      Appearance: She is well-developed and well-nourished.  Eyes:     Conjunctiva/sclera: Conjunctivae normal.  Cardiovascular:     Rate and Rhythm: Normal rate and regular rhythm.     Pulses: Normal pulses.     Heart sounds: Normal heart sounds.  Pulmonary:     Effort: Pulmonary effort is normal.     Breath sounds: Normal breath sounds. No wheezing, rhonchi or rales.  Musculoskeletal:     Right lower leg: No edema.     Left lower leg: No edema.  Skin:    General: Skin is warm and dry.  Neurological:     Mental Status: She is alert.  Psychiatric:        Mood and Affect: Mood and affect normal.        Speech: Speech normal.        Behavior: Behavior normal.        Thought Content: Thought content normal.        Assessment & Plan:   Problem List Items Addressed This Visit      Cardiovascular and Mediastinum   Pulmonary embolism ([place]) - Primary    New, bilateral. Etiology concerning for starting oral combined birth control, and/or long distance travel and/or viral URI ( negative flu, covid, rsv).  Unable to see films as diagnosed in [place]. Advised to stop birth control and not to go any upcoming flights. Continue eliquis 5mg  until seen by hematology. Walked with patient and HR remained in 90's, Sa02 99%. She was breathing heavy while we walked and she wore her mask however she was not labored in speech, no acute respiratory distress. Pending cxr. Referral to hematology. Close follow up.       Relevant Medications   ELIQUIS 5 MG TABS tablet   albuterol (VENTOLIN HFA) 108 (90 Base) MCG/ACT inhaler   Other Relevant Orders   TSH   CBC with Differential/Platelet   Comprehensive metabolic panel   Hemoglobin A1c   Lipid panel   VITAMIN D 25 Hydroxy (Vit-D Deficiency, Fractures)   Ambulatory referral to Hematology   DG Chest 2 View       I have discontinued Enma J.  Smith's metFORMIN, venlafaxine XR, and norgestimate-ethinyl estradiol. I have also changed her Eliquis. Additionally, I am having her start on albuterol. Lastly, I am having her maintain her linaclotide.   Meds ordered this encounter  Medications  . ELIQUIS 5 MG TABS tablet    Sig: Take 1  tablet (5 mg total) by mouth 2 (two) times daily.    Dispense:  60 tablet    Refill:  2    Order Specific Question:   Supervising Provider    Answer:   Deborra Medina L [[id number]]  . albuterol (VENTOLIN HFA) 108 (90 Base) MCG/ACT inhaler    Sig: Inhale 2 puffs into the lungs every 6 (six) hours as needed for wheezing or shortness of breath.    Dispense:  8 g    Refill:  0    Order Specific Question:   Supervising Provider    Answer:   Crecencio Mc [[id number]]    Return precautions given.   Risks, benefits, and alternatives of the medications and treatment plan prescribed today were discussed, and patient expressed understanding.   Education regarding symptom management and diagnosis given to patient on AVS.  Continue to follow with Burnard Hawthorne, FNP for routine health maintenance.   Aline Brochure and I agreed with plan.   Mable Paris, FNP  I have spent 35 minutes with a patient including gathering history, discussion plan of care and in particular discussing potential etiologies, risk factors for PE.

## 2020-11-30 NOTE — Assessment & Plan Note (Signed)
New, bilateral. Etiology concerning for starting oral combined birth control, and/or long distance travel and/or viral URI ( negative flu, covid, rsv).  Unable to see films as diagnosed in [place]. Advised to stop birth control and not to go any upcoming flights. Continue eliquis 5mg  until seen by hematology. Walked with patient and HR remained in 90's, Sa02 99%. She was breathing heavy while we walked and she wore her mask however she was not labored in speech, no acute respiratory distress. Pending cxr. Referral to hematology. Close follow up.

## 2020-11-30 NOTE — Patient Instructions (Signed)
STOP birth control. Do not travel or fly until you are seen by hematology; I have referred  You to hematology for further evaluation. Let us know if you dont hear back within a week in regards to an appointment being scheduled.  Use albuterol every 6 hours for first 24 hours to get good medication into the lungs and loosen congestion; after, you may use as needed and eventually stop all together when cough resolves.  Please let me know if you need anything.

## 2020-12-01 ENCOUNTER — Other Ambulatory Visit: Payer: Self-pay | Admitting: Family

## 2020-12-01 ENCOUNTER — Telehealth: Payer: Self-pay

## 2020-12-01 DIAGNOSIS — I2699 Other pulmonary embolism without acute cor pulmonale: Secondary | ICD-10-CM

## 2020-12-01 LAB — LIPID PANEL
Cholesterol: 256 mg/dL — ABNORMAL HIGH (ref 0–200)
HDL: 52.1 mg/dL (ref >39.00)
LDL Cholesterol: 176 mg/dL — ABNORMAL HIGH (ref 0–99)
NonHDL: 203.88
Total CHOL/HDL Ratio: 5
Triglycerides: 140 mg/dL (ref 0.0–149.0)
VLDL: 28 mg/dL (ref 0.0–40.0)

## 2020-12-01 NOTE — Telephone Encounter (Signed)
LMTCB for xray results.  °

## 2020-12-02 ENCOUNTER — Other Ambulatory Visit: Payer: Self-pay | Admitting: Family

## 2020-12-02 DIAGNOSIS — E559 Vitamin D deficiency, unspecified: Secondary | ICD-10-CM

## 2020-12-02 DIAGNOSIS — R899 Unspecified abnormal finding in specimens from other organs, systems and tissues: Secondary | ICD-10-CM

## 2020-12-02 MED ORDER — CHOLECALCIFEROL 1.25 MG (50000 UT) PO TABS: ORAL_TABLET | ORAL | 0 refills | Status: DC

## 2020-12-04 ENCOUNTER — Emergency Department (HOSPITAL_COMMUNITY)
Admission: EM | Admit: 2020-12-04 | Discharge: 2020-12-05 | Disposition: A | Discharge status: Home / Self Care | Payer: 59 | Attending: Emergency Medicine | Admitting: Emergency Medicine

## 2020-12-04 ENCOUNTER — Ambulatory Visit: Payer: Self-pay | Admitting: Family

## 2020-12-04 ENCOUNTER — Encounter (HOSPITAL_COMMUNITY): Payer: Self-pay | Admitting: *Deleted

## 2020-12-04 ENCOUNTER — Other Ambulatory Visit: Payer: Self-pay

## 2020-12-04 ENCOUNTER — Emergency Department (HOSPITAL_COMMUNITY): Payer: 59

## 2020-12-04 DIAGNOSIS — R509 Fever, unspecified: Secondary | ICD-10-CM | POA: Diagnosis present

## 2020-12-04 DIAGNOSIS — R000 Tachycardia, unspecified: Secondary | ICD-10-CM | POA: Diagnosis not present

## 2020-12-04 DIAGNOSIS — I10 Essential (primary) hypertension: Secondary | ICD-10-CM | POA: Diagnosis not present

## 2020-12-04 DIAGNOSIS — U071 COVID-19: Secondary | ICD-10-CM | POA: Insufficient documentation

## 2020-12-04 DIAGNOSIS — I2694 Multiple subsegmental pulmonary emboli without acute cor pulmonale: Secondary | ICD-10-CM | POA: Diagnosis not present

## 2020-12-04 HISTORY — DX: Other pulmonary embolism without acute cor pulmonale: I26.99

## 2020-12-04 LAB — RESP PANEL BY RT-PCR (FLU A&B, COVID) ARPGX2
Influenza A by PCR: NEGATIVE
Influenza B by PCR: NEGATIVE
SARS Coronavirus 2 by RT PCR: POSITIVE — AB

## 2020-12-04 LAB — URINALYSIS, ROUTINE W REFLEX MICROSCOPIC
Bilirubin Urine: NEGATIVE
Glucose, UA: NEGATIVE mg/dL
Ketones, ur: 20 mg/dL — AB
Leukocytes,Ua: NEGATIVE
Nitrite: NEGATIVE
Protein, ur: 30 mg/dL — AB
Specific Gravity, Urine: 1.025 (ref 1.005–1.030)
pH: 5 (ref 5.0–8.0)

## 2020-12-04 LAB — CBC WITH DIFFERENTIAL/PLATELET
Abs Immature Granulocytes: 0.01 10*3/uL (ref 0.00–0.07)
Basophils Absolute: 0 10*3/uL (ref 0.0–0.1)
Basophils Relative: 0 %
Eosinophils Absolute: 0 10*3/uL (ref 0.0–0.5)
Eosinophils Relative: 0 %
HCT: 37.5 % (ref 36.0–46.0)
Hemoglobin: 12.1 g/dL (ref 12.0–15.0)
Immature Granulocytes: 0 %
Lymphocytes Relative: 6 %
Lymphs Abs: 0.4 10*3/uL — ABNORMAL LOW (ref 0.7–4.0)
MCH: 25.9 pg — ABNORMAL LOW (ref 26.0–34.0)
MCHC: 32.3 g/dL (ref 30.0–36.0)
MCV: 80.3 fL (ref 80.0–100.0)
Monocytes Absolute: 0.6 10*3/uL (ref 0.1–1.0)
Monocytes Relative: 8 %
Neutro Abs: 6.1 10*3/uL (ref 1.7–7.7)
Neutrophils Relative %: 86 %
Platelets: 351 10*3/uL (ref 150–400)
RBC: 4.67 MIL/uL (ref 3.87–5.11)
RDW: 13.9 % (ref 11.5–15.5)
WBC: 7.2 10*3/uL (ref 4.0–10.5)
nRBC: 0 % (ref 0.0–0.2)

## 2020-12-04 LAB — TROPONIN I (HIGH SENSITIVITY): Troponin I (High Sensitivity): 3 ng/L (ref <18)

## 2020-12-04 LAB — COMPREHENSIVE METABOLIC PANEL
ALT: 22 U/L (ref 0–44)
AST: 20 U/L (ref 15–41)
Albumin: 3.7 g/dL (ref 3.5–5.0)
Alkaline Phosphatase: 50 U/L (ref 38–126)
Anion gap: 10 (ref 5–15)
BUN: 12 mg/dL (ref 6–20)
CO2: 22 mmol/L (ref 22–32)
Calcium: 9.2 mg/dL (ref 8.9–10.3)
Chloride: 101 mmol/L (ref 98–111)
Creatinine, Ser: 1.22 mg/dL — ABNORMAL HIGH (ref 0.44–1.00)
GFR, Estimated: >60 mL/min (ref >60)
Glucose, Bld: 101 mg/dL — ABNORMAL HIGH (ref 70–99)
Potassium: 3.5 mmol/L (ref 3.5–5.1)
Sodium: 133 mmol/L — ABNORMAL LOW (ref 135–145)
Total Bilirubin: 0.4 mg/dL (ref 0.3–1.2)
Total Protein: 7.8 g/dL (ref 6.5–8.1)

## 2020-12-04 LAB — PREGNANCY, URINE: Preg Test, Ur: NEGATIVE

## 2020-12-04 LAB — LACTIC ACID, PLASMA
Lactic Acid, Venous: 1.4 mmol/L (ref 0.5–1.9)
Lactic Acid, Venous: 1.2 mmol/L (ref 0.5–1.9)

## 2020-12-04 LAB — BRAIN NATRIURETIC PEPTIDE: B Natriuretic Peptide: 40 pg/mL (ref 0.0–100.0)

## 2020-12-04 MED ORDER — IOHEXOL 350 MG/ML SOLN
100.0000 mL | Freq: Once | INTRAVENOUS | Status: AC | PRN
  Administered 2020-12-04: 100 mL via INTRAVENOUS

## 2020-12-04 MED ORDER — ACETAMINOPHEN 500 MG PO TABS
1000.0000 mg | ORAL_TABLET | Freq: Once | ORAL | Status: AC
  Administered 2020-12-04: 1000 mg via ORAL
  Filled 2020-12-04: qty 2

## 2020-12-04 MED ORDER — ONDANSETRON HCL 4 MG/2ML IJ SOLN
4.0000 mg | Freq: Once | INTRAMUSCULAR | Status: AC
  Administered 2020-12-04: 4 mg via INTRAVENOUS
  Filled 2020-12-04: qty 2

## 2020-12-04 MED ORDER — SODIUM CHLORIDE 0.9 % IV BOLUS
500.0000 mL | Freq: Once | INTRAVENOUS | Status: AC
  Administered 2020-12-04: 500 mL via INTRAVENOUS

## 2020-12-04 NOTE — ED Triage Notes (Addendum)
Pt with fever and HA starting today.  Pt with recent dx of PE's on 12/26 and currently on Eliquis taking both doses at one time.

## 2020-12-04 NOTE — ED Triage Notes (Addendum)
Pt high fever in triage, last took tylenol 1000 mg at 1600 today.  Noted pt coughing in triage. +emesis per pt.  +bilateral feet swelling

## 2020-12-04 NOTE — ED Provider Notes (Signed)
Emergency Department Provider Note   I have reviewed the triage vital signs and the nursing notes.   HISTORY  Chief Complaint Fever   HPI Terri Cochran is a 30 y.o. female with past medical history of recently diagnosed PE currently on Eliquis presents to the emergency department with fever, headache, cough, body aches, nausea/vomiting symptoms.  Patient reports worsening symptoms over the past week.  She has had some associated shortness of breath and chest pain.  She states that she has been following with her primary care doctor after her recent PE diagnosis.  She has been compliant with her medications.  She noted fever increasing this evening despite taking Tylenol at 4 PM and so presented to the emergency department.  She denies diarrhea symptoms.  She is having some diffuse, abdominal discomfort.  No radiation of symptoms or other modifying factors.  Patient's PE was diagnosed recently in [place]. She was not admitted but started on Eliquis and discharged home to f/u with her PCP here which she did.    Past Medical History:  Diagnosis Date  . Anxiety   . Complication of anesthesia    WOKE UP DURING WISDOM TOOTH SURGERY  . Depression   . Deviated septum   . Fatty liver disease, nonalcoholic   . GERD (gastroesophageal reflux disease)   . Headache   . Hypertension    no meds taken  . PE (pulmonary thromboembolism) [facility])     Patient Active Problem List   Diagnosis Date Noted  . Pulmonary embolism ([place]) 11/30/2020  . Dizziness 09/14/2020  . Left shoulder pain 09/14/2020  . Screening for STD (sexually transmitted disease) 09/14/2020  . Anxiety and depression 06/12/2020  . Depression, recurrent ([place]) 06/12/2020  . Chronic insomnia 06/12/2020  . Stress 06/12/2020  . Abnormal CT scan 03/21/2020  . Chronic bilateral lower abdominal pain 02/19/2020  . History of Teratoma 02/19/2020  . BRBPR (bright red blood per rectum) 02/19/2020  . Chronic constipation 02/19/2020  .  Non-intractable vomiting 02/19/2020  . Abnormal MRI, liver 02/19/2020  . Benign teratoma of adrenal gland, 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  . Vaginal bleeding 02/04/2020  . Benign mass of left adrenal gland ([place]) 01/24/2020  . Change in stool 11/29/2019  . Fatty liver 11/25/2019  . Vaginal discharge 11/11/2019  . Lab test positive for detection of COVID-19 virus 11/11/2019  . Gastroesophageal reflux disease 11/11/2019  . Left adrenal mass ([place]) 11/11/2019  . Adjustment reaction with anxiety and depression 11/01/2019  . Low back pain 11/01/2019  . Encounter for preconception consultation 11/01/2019    Past Surgical History:  Procedure Laterality Date  . ADRENALECTOMY Left 01/24/2020   Procedure: Open Left ADRENALECTOMY;  Surgeon: Fredirick Maudlin, MD;  Location: [facility] ORS;  Service: General;  Laterality: Left;  . BREAST REDUCTION SURGERY    . NASAL SEPTUM SURGERY     X2  . TONSILLECTOMY    . WISDOM TOOTH EXTRACTION      Allergies Patient has no known allergies.  Family History  Problem Relation Age of Onset  . Heart attack Father   . Diabetes Sister   . Hyperlipidemia Sister   . Hypertension Sister   . Heart attack Paternal Aunt   . Heart disease Paternal Aunt   . Heart attack Paternal Aunt   . Heart disease Paternal Aunt   . Heart attack Paternal Aunt   . Heart disease Paternal Aunt   . Colon cancer Neg Hx   . Esophageal cancer Neg Hx   . Pancreatic cancer  Neg Hx   . Stomach cancer Neg Hx   . Inflammatory bowel disease Neg Hx   . Liver disease Neg Hx   . Rectal cancer Neg Hx   . Clotting disorder Neg Hx   . Bleeding Disorder Neg Hx     Social History Social History   Tobacco Use  . Smoking status: Never Smoker  . Smokeless tobacco: Never Used  Vaping Use  . Vaping Use: Never used  Substance Use Topics  . Alcohol use: Yes    Comment: occasionally  . Drug use: Never    Review of Systems  Constitutional: Positive fever/chills and body aches.  Eyes: No visual  changes. ENT: Positive sore throat. Cardiovascular: Positive chest pain. Respiratory: Positive shortness of breath. Gastrointestinal: Positive abdominal pain. Positive nausea and vomiting.  No diarrhea.  No constipation. Genitourinary: Negative for dysuria. Positive urine frequency.  Musculoskeletal: Negative for back pain. Positive diffuse body aches.  Skin: Negative for rash. Neurological: Negative for focal weakness or numbness. Positive HA.   10-point ROS otherwise negative.  ____________________________________________   PHYSICAL EXAM:  VITAL SIGNS: ED Triage Vitals  Enc Vitals Group     BP 12/04/20 1952 130/78     Pulse Rate 12/04/20 1952 (!) 124     Resp 12/04/20 1952 20     Temp 12/04/20 1952 (!) 102.3 F (39.1 C)     Temp Source 12/04/20 1952 Oral     SpO2 12/04/20 1952 97 %     Weight 12/04/20 1949 222 lb (100.7 kg)     Height 12/04/20 1949 5\' 6"  (1.676 m)   Constitutional: Alert and oriented. Well appearing and in no acute distress. Eyes: Conjunctivae are normal.  Head: Atraumatic. Nose: No congestion/rhinnorhea. Mouth/Throat: Mucous membranes are moist.   Neck: No stridor.   Cardiovascular: Normal rate, regular rhythm. Good peripheral circulation. Grossly normal heart sounds.   Respiratory: Normal respiratory effort.  No retractions. Lungs CTAB. Gastrointestinal: Soft with mild diffuse tenderness. No distention.  Musculoskeletal: No lower extremity tenderness with mild bilateral pitting edema. No gross deformities of extremities. Neurologic:  Normal speech and language. No gross focal neurologic deficits are appreciated.  Skin:  Skin is warm, dry and intact. No rash noted.   ____________________________________________   LABS (all labs ordered are listed, but only abnormal results are displayed)  Labs Reviewed  RESP PANEL BY RT-PCR (FLU A&B, COVID) ARPGX2 - Abnormal; Notable for the following components:      Result Value   SARS Coronavirus 2 by RT PCR  POSITIVE (*)    All other components within normal limits  CBC WITH DIFFERENTIAL/PLATELET - Abnormal; Notable for the following components:   MCH 25.9 (*)    Lymphs Abs 0.4 (*)    All other components within normal limits  COMPREHENSIVE METABOLIC PANEL - Abnormal; Notable for the following components:   Sodium 133 (*)    Glucose, Bld 101 (*)    Creatinine, Ser 1.22 (*)    All other components within normal limits  URINALYSIS, ROUTINE W REFLEX MICROSCOPIC - Abnormal; Notable for the following components:   Color, Urine AMBER (*)    APPearance CLOUDY (*)    Hgb urine dipstick SMALL (*)    Ketones, ur 20 (*)    Protein, ur 30 (*)    Bacteria, UA RARE (*)    All other components within normal limits  PREGNANCY, URINE  LACTIC ACID, PLASMA  LACTIC ACID, PLASMA  BRAIN NATRIURETIC PEPTIDE  TROPONIN I (HIGH SENSITIVITY)  TROPONIN  I (HIGH SENSITIVITY)   ____________________________________________  EKG   EKG Interpretation  Date/Time:  Friday December 04 2020 22:02:33 EST Ventricular Rate:  110 PR Interval:    QRS Duration: 75 QT Interval:  319 QTC Calculation: 432 R Axis:   68 Text Interpretation: Sinus tachycardia Borderline T wave abnormalities Baseline wander in lead(s) V5 V6 no stemi Confirmed by Nanda Quinton [phone]) on 12/04/2020 10:10:07 PM       ____________________________________________  RADIOLOGY  CT Angio Chest PE W and/or Wo Contrast  Result Date: 12/05/2020 CLINICAL DATA:  PE suspected.  Shortness of breath. EXAM: CT ANGIOGRAPHY CHEST WITH CONTRAST TECHNIQUE: Multidetector CT imaging of the chest was performed using the standard protocol during bolus administration of intravenous contrast. Multiplanar CT image reconstructions and MIPs were obtained to evaluate the vascular anatomy. CONTRAST:  159mL OMNIPAQUE IOHEXOL 350 MG/ML SOLN COMPARISON:  None. FINDINGS: Cardiovascular: Contrast injection is sufficient to demonstrate satisfactory opacification of the  pulmonary arteries to the segmental level. Acute bilateral pulmonary emboli are noted. There are lobar, segmental and subsegmental on the left, greatest within the left lower lobe. The distribution is primarily segmental subsegmental on the right. There is no CT evidence for right-sided heart strain. There are no significant atherosclerotic changes of the thoracic aorta. There is no evidence for dissection or aneurysm. Mediastinum/Nodes: -- No mediastinal lymphadenopathy. -- No hilar lymphadenopathy. -- No axillary lymphadenopathy. -- No supraclavicular lymphadenopathy. --there is a 1.8 cm thyroid nodule in the isthmus. -  Unremarkable esophagus. Lungs/Pleura: There is a small left-sided pleural effusion. There is a nonenhancing airspace opacity in the left lower lobe suspicious for a developing pulmonary infarct. There is a similar opacity in the lingula also concerning for pulmonary infarct. There is no pneumothorax. Upper Abdomen: Contrast bolus timing is not optimized for evaluation of the abdominal organs. There is a small residual fluid collection in the adrenalectomy bed, decreased in size from prior study in April. The collection currently measures approximately 2.3 cm. There is a stable small collection abutting the pancreatic tail measuring approximately 1.5 cm (axial series 4, image 95). Musculoskeletal: No chest wall abnormality. No bony spinal canal stenosis. Review of the MIP images confirms the above findings. IMPRESSION: 1. Acute bilateral pulmonary emboli, greatest within the left lower lobe. No CT evidence for right-sided heart strain. 2. Small left-sided pleural effusion. 3. Probable developing pulmonary infarcts in the left lower lobe and lingula. 4. A 1.8 cm thyroid nodule in the isthmus. Follow-up with an outpatient thyroid ultrasound is recommended.(Ref: J Am Coll Radiol. 2015 Feb;12(2): 143-50). 5. Small residual collections in the left upper quadrant, decreased in size from prior CT abdomen  pelvis in April. These are favored to represent resolving postoperative seromas. These results were called by telephone at the time of interpretation on 12/05/2020 at 12:30 am to provider Damita Lack , who verbally acknowledged these results. Electronically Signed   By: Constance Holster M.D.   On: 12/05/2020 00:32   DG Chest Port 1 View  Result Date: 12/04/2020 CLINICAL DATA:  Shortness of breath EXAM: PORTABLE CHEST 1 VIEW COMPARISON:  None. FINDINGS: The heart size and mediastinal contours are within normal limits. Both lungs are clear. The visualized skeletal structures are unremarkable. IMPRESSION: No active disease. Electronically Signed   By: Prudencio Pair M.D.   On: 12/04/2020 20:57    ____________________________________________   PROCEDURES  Procedure(s) performed:   Procedures  None ____________________________________________   INITIAL IMPRESSION / ASSESSMENT AND PLAN / ED COURSE  Pertinent labs & imaging  results that were available during my care of the patient were reviewed by me and considered in my medical decision making (see chart for details).   Patient presents to the ED with COVID-like symptoms. Has been recently diagnosed with PE and is anticoagulated. No hypoxemia. Some tachycardia here but associated fever. Will give tylenol, zofran, and IVF. No focal tenderness or peritoneal findings on exam. Hold on advanced imaging for now.   10:54 PM  Patient reevaluated.  Her heart rate is decreasing to some extent but continues to have relatively consistent tachycardia.  I have no real indication as to how much PE burden there is or if her PE burden has increased or she is developed a more central clot.  Troponin is pending.  Plan for repeat CTA and reassess after the results.   Care transferred to Dr. Sedonia Small.    Caliya Rogus was evaluated in Emergency Department for the symptoms described in the history of present illness. She was evaluated in the context of the global COVID-19  pandemic, which necessitated consideration that the patient might be at risk for infection with the SARS-CoV-2 virus that causes COVID-19. Institutional protocols and algorithms that pertain to the evaluation of patients at risk for COVID-19 are in a state of rapid change based on information released by regulatory bodies including the CDC and federal and state organizations. These policies and algorithms were followed during the patient's care in the ED.  ____________________________________________  FINAL CLINICAL IMPRESSION(S) / ED DIAGNOSES  Final diagnoses:  COVID-19  Multiple subsegmental pulmonary emboli without acute cor pulmonale (HCC)     MEDICATIONS GIVEN DURING THIS VISIT:  Medications  sodium chloride 0.9 % bolus 500 mL (0 mLs Intravenous Stopped 12/04/20 2254)  ondansetron (ZOFRAN) injection 4 mg (4 mg Intravenous Given 12/04/20 2156)  acetaminophen (TYLENOL) tablet 1,000 mg (1,000 mg Oral Given 12/04/20 2156)  iohexol (OMNIPAQUE) 350 MG/ML injection 100 mL (100 mLs Intravenous Contrast Given 12/04/20 2357)     NEW OUTPATIENT MEDICATIONS STARTED DURING THIS VISIT:  Discharge Medication List as of 12/05/2020 12:40 AM    START taking these medications   Details  ondansetron (ZOFRAN ODT) 4 MG disintegrating tablet Take 1 tablet (4 mg total) by mouth every 8 (eight) hours as needed for nausea or vomiting., Starting Sat 12/05/2020, Print        Note:  This document was prepared using Dragon voice recognition software and may include unintentional dictation errors.  Nanda Quinton, MD, [facility]    Brittiany Wiehe, [facility], MD 12/05/20 [phone]

## 2020-12-04 NOTE — ED Notes (Signed)
Per triage report, questionable if pt is taking her blood thinners as directed   Here due to fever and leg swelling

## 2020-12-04 NOTE — ED Notes (Signed)
Date and time results received: 12/04/20 2315  Test: COVID Critical Value: positive  Name of Provider Notified: Sedonia Small, MD  Orders Received? Or Actions Taken?: acknowledged

## 2020-12-05 LAB — TROPONIN I (HIGH SENSITIVITY): Troponin I (High Sensitivity): 3 ng/L (ref <18)

## 2020-12-05 MED ORDER — ONDANSETRON 4 MG PO TBDP: 4.0000 mg | ORAL_TABLET | Freq: Three times a day (TID) | ORAL | 0 refills | Status: DC | PRN

## 2020-12-05 NOTE — Discharge Instructions (Addendum)
You were evaluated in the Emergency Department and after careful evaluation, we did not find any emergent condition requiring admission or further testing in the hospital.  Your exam/testing today was overall reassuring.  Your fever and body aches seem to be due to COVID-19.  He tested positive for coronavirus here in the emergency department.  Your CT scan shows that you still have blood clots in your lungs, but this is to be expected.  There is no evidence of heart strain or other emergencies.  Please continue taking the Eliquis blood thinner as prescribed.  Use Tylenol or Motrin at home for discomfort or fever.  Use the Zofran prescription provided for nausea.  Drink plenty of fluids at home.  Please return to the Emergency Department if you experience any worsening of your condition.  Thank you for allowing [place] to be a part of your care.

## 2020-12-05 NOTE — ED Provider Notes (Signed)
  Provider Note MRN:  [id number]  Arrival date & time: 12/05/20    ED Course and Medical Decision Making  Assumed care from Dr. Laverta Baltimore at shift change.  Patient has tested positive for coronavirus, likely explaining patient's fever and body aches.  On my assessment she is in no acute distress, heart rate 98, no increased work of breathing, oxygen saturation 100%.  CT demonstrating multiple PEs but no heart strain.  Troponin is negative.  Patient is feeling much better, she is already prescribed Eliquis, there is no indication for further testing or admission here in the hospital.  Can use Tylenol and Motrin at home, will isolate at home.  Appropriate for discharge.  Strict return precautions for worsening shortness of breath.  Procedures  Final Clinical Impressions(s) / ED Diagnoses     ICD-10-CM   1. COVID-19  U07.1   2. Fever  R50.9 DG Chest [facility] 1 View    DG Chest Port 1 View  3. Multiple subsegmental pulmonary emboli without acute cor pulmonale (HCC)  I26.94     ED Discharge Orders         Ordered    ondansetron (ZOFRAN ODT) 4 MG disintegrating tablet  Every 8 hours PRN        12/05/20 0040            Discharge Instructions     You were evaluated in the Emergency Department and after careful evaluation, we did not find any emergent condition requiring admission or further testing in the hospital.  Your exam/testing today was overall reassuring.  Your fever and body aches seem to be due to COVID-19.  He tested positive for coronavirus here in the emergency department.  Your CT scan shows that you still have blood clots in your lungs, but this is to be expected.  There is no evidence of heart strain or other emergencies.  Please continue taking the Eliquis blood thinner as prescribed.  Use Tylenol or Motrin at home for discomfort or fever.  Use the Zofran prescription provided for nausea.  Drink plenty of fluids at home.  Please return to the Emergency Department if you experience  any worsening of your condition.  Thank you for allowing [place] to be a part of your care.     Barth Kirks. Sedonia Small, [place] [email or web address]    Maudie Flakes, MD 12/05/20 [phone]

## 2020-12-07 ENCOUNTER — Telehealth: Payer: Self-pay | Admitting: Family

## 2020-12-07 DIAGNOSIS — E041 Nontoxic single thyroid nodule: Secondary | ICD-10-CM

## 2020-12-07 NOTE — Telephone Encounter (Signed)
Call pt Please sch ED follow up appt I want to review CT angio chest with her.  She has thyroid nodule which will need imaging, I have gone ahead and ordered

## 2020-12-08 NOTE — Telephone Encounter (Signed)
Patient scheduled for 10/28/21

## 2020-12-11 ENCOUNTER — Ambulatory Visit (HOSPITAL_COMMUNITY): Payer: Self-pay | Admitting: Hematology and Oncology

## 2020-12-18 ENCOUNTER — Ambulatory Visit (INDEPENDENT_AMBULATORY_CARE_PROVIDER_SITE_OTHER): Payer: 59 | Admitting: Family

## 2020-12-18 ENCOUNTER — Encounter: Payer: Self-pay | Admitting: Family

## 2020-12-18 ENCOUNTER — Ambulatory Visit: Payer: Self-pay | Admitting: Family

## 2020-12-18 ENCOUNTER — Other Ambulatory Visit: Payer: Self-pay

## 2020-12-18 ENCOUNTER — Ambulatory Visit: Payer: 59

## 2020-12-18 VITALS — BP 102/64 | HR 88 | Temp 98.3°F | Ht 66.0 in | Wt 224.0 lb

## 2020-12-18 DIAGNOSIS — I2699 Other pulmonary embolism without acute cor pulmonale: Secondary | ICD-10-CM | POA: Diagnosis not present

## 2020-12-18 DIAGNOSIS — E896 Postprocedural adrenocortical (-medullary) hypofunction: Secondary | ICD-10-CM

## 2020-12-18 DIAGNOSIS — R109 Unspecified abdominal pain: Secondary | ICD-10-CM

## 2020-12-18 DIAGNOSIS — N898 Other specified noninflammatory disorders of vagina: Secondary | ICD-10-CM

## 2020-12-18 DIAGNOSIS — U071 COVID-19: Secondary | ICD-10-CM | POA: Diagnosis not present

## 2020-12-18 DIAGNOSIS — E041 Nontoxic single thyroid nodule: Secondary | ICD-10-CM

## 2020-12-18 DIAGNOSIS — L0291 Cutaneous abscess, unspecified: Secondary | ICD-10-CM

## 2020-12-18 MED ORDER — FLUCONAZOLE 150 MG PO TABS: 150.0000 mg | ORAL_TABLET | Freq: Once | ORAL | 1 refills | Status: AC

## 2020-12-18 MED ORDER — MUPIROCIN 2 % EX OINT: 1.0000 "application " | TOPICAL_OINTMENT | Freq: Two times a day (BID) | CUTANEOUS | 0 refills | Status: DC

## 2020-12-18 NOTE — Assessment & Plan Note (Signed)
Symptoms resolved 

## 2020-12-18 NOTE — Progress Notes (Signed)
Subjective:    Patient ID: Terri Cochran, female    DOB: August 03, 1991, 30 y.o.   MRN: [id number]  CC: Terri Cochran is a 30 y.o. female who presents today for follow up.   HPI: No further respiratory complaints.   fever, cough, sob resolved.  NO cp.   Sister has moved in with her and she is excited to have family with her.  Depression and anxiety is 'much better.' No si/hi.  Complains of left 'cyst' under left abdomen, 6 months, slightly smaller. This area stays moist.  She has seen intermittent green colored purulent discharge with blood from the cyst. She is no longer waxing suprapubic area.  She also complains of thick white vaginal discharge.  Has tried monistat without relief.  Has been using hot water, tea tree oil.  No vaginal itching nor concerns for STDs   She is compliant with Eliquis. No bleeding.   No h/o mrsa.   She is not on birth control. No concerns for pregnancy. She is not sexually active.   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 This pain is intermittent. No exacerbating features. Pain is not related to eating.    ED 14 days ago for fever, body aches Troponin negative covid positive 12/04/20 CTA-Acute bilateral pulmonary emboli are noted. No right sided heart strain. No dissection or aneurysm. 1.8cm thyroid nodule. 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  Left adrenalectomy Dr Celine Ahr 01/2020     Thyroid US is scheduled  HISTORY:  Past Medical History:  Diagnosis Date  . Anxiety   . Complication of anesthesia    WOKE UP DURING WISDOM TOOTH SURGERY  . Depression   . Deviated septum   . Fatty liver disease, nonalcoholic   . GERD (gastroesophageal reflux disease)   . Headache   . Hypertension    no meds taken  . PE (pulmonary thromboembolism) ([place])    Past Surgical History:   Procedure Laterality Date  . ADRENALECTOMY Left 01/24/2020   Procedure: Open Left ADRENALECTOMY;  Surgeon: Fredirick Maudlin, MD;  Location: [facility] ORS;  Service: General;  Laterality: Left;  . BREAST REDUCTION SURGERY    . NASAL SEPTUM SURGERY     X2  . TONSILLECTOMY    . WISDOM TOOTH EXTRACTION     Family History  Problem Relation Age of Onset  . Heart attack Father   . Diabetes Sister   . Hyperlipidemia Sister   . Hypertension Sister   . Heart attack Paternal Aunt   . Heart disease Paternal Aunt   . Heart attack Paternal Aunt   . Heart disease Paternal Aunt   . Heart attack Paternal Aunt   . Heart disease Paternal Aunt   . Colon cancer Neg Hx   . Esophageal cancer Neg Hx   . Pancreatic cancer Neg Hx   . Stomach cancer Neg Hx   . Inflammatory bowel disease Neg Hx   . Liver disease Neg Hx   . Rectal cancer Neg Hx   . Clotting disorder Neg Hx   . Bleeding Disorder Neg Hx     Allergies: Patient has no known allergies. Current Outpatient Medications on File Prior to Visit  Medication Sig Dispense Refill  . Cholecalciferol 1.25 MG (50000 UT) TABS 50,000 units PO qwk for 8 weeks. 8 tablet 0  . ELIQUIS 5 MG TABS tablet Take  1 tablet (5 mg total) by mouth 2 (two) times daily. 60 tablet 2  . linaclotide (LINZESS) 290 MCG CAPS capsule Take 1 capsule (290 mcg total) by mouth daily before breakfast. (Patient not taking: Reported on 12/18/2020) 30 capsule 2  . ondansetron (ZOFRAN ODT) 4 MG disintegrating tablet Take 1 tablet (4 mg total) by mouth every 8 (eight) hours as needed for nausea or vomiting. (Patient not taking: Reported on 12/18/2020) 20 tablet 0   No current facility-administered medications on file prior to visit.    Social History   Tobacco Use  . Smoking status: Never Smoker  . Smokeless tobacco: Never Used  Vaping Use  . Vaping Use: Never used  Substance Use Topics  . Alcohol use: Yes    Comment: occasionally  . Drug use: Never    Review of Systems   Constitutional: Negative for chills and fever.  Respiratory: Negative for cough and shortness of breath.   Cardiovascular: Negative for chest pain and palpitations.  Gastrointestinal: Negative for nausea and vomiting.  Genitourinary: Positive for flank pain (left ) and vaginal discharge.  Skin: Positive for wound.  Psychiatric/Behavioral: Negative for suicidal ideas.      Objective:    BP 102/64   Pulse 88   Temp 98.3 F (36.8 C)   Ht 5\' 6"  (1.676 m)   Wt 224 lb (101.6 kg)   LMP 11/25/2020   SpO2 98%   BMI 36.15 kg/m  BP Readings from Last 3 Encounters:  12/18/20 102/64  12/05/20 135/75  11/30/20 118/82   Wt Readings from Last 3 Encounters:  12/18/20 224 lb (101.6 kg)  12/04/20 222 lb (100.7 kg)  11/30/20 226 lb 9.6 oz (102.8 kg)    Physical Exam Vitals reviewed.  Constitutional:      Appearance: She is well-developed and well-nourished.  Eyes:     Conjunctiva/sclera: Conjunctivae normal.  Cardiovascular:     Rate and Rhythm: Normal rate and regular rhythm.     Pulses: Normal pulses.     Heart sounds: Normal heart sounds.  Pulmonary:     Effort: Pulmonary effort is normal.     Breath sounds: Normal breath sounds. No wheezing, rhonchi or rales.  Skin:    General: Skin is warm and dry.     Comments: Nickel sized papule noted left pannus. No surrounding erythema. Non tender, fluctuant.   Neurological:     Mental Status: She is alert.  Psychiatric:        Mood and Affect: Mood and affect normal.        Speech: Speech normal.        Behavior: Behavior normal.        Thought Content: Thought content normal.        Assessment & Plan:   Problem List Items Addressed This Visit      Cardiovascular and Mediastinum   Pulmonary embolism [facility])    Upcoming appointment with hematology. Continue eliquis 5mg  bid. Will follow. With respiratory symptoms improving , patient preferred to wait to repeat CXR until follow up in March with me. We discussed need to repeat for  resolution of pleural effusion and I agreed that appropriate to wait until our scheduled appointment.   CTA reviewed reviewed with supervising, Dr Deborra Medina, and she and I jointly agreed on management plan and seeing hematology.          Endocrine   Thyroid nodule     Other   Abscess - Primary    ? Folliculitis  versus resolving abscess. Trial mupirocin.       Relevant Medications   mupirocin ointment (BACTROBAN) 2 %   COVID-19    Symptoms resolved.       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       Left flank pain    I have sent message to Dr Celine Ahr regarding : There is a small residual fluid collection in the adrenalectomy bed, decreased in size from prior and  stable small collection abutting the pancreatic tail measuring approximately 1.5 cm. Dr Celine Ahr didn't feel that follow up needed with her.       Vaginal discharge    Empiric treatment with diflucan. She will let me know if symptoms do not resolve.           I am having Terri Cochran start on fluconazole and mupirocin ointment. I am also having her maintain her linaclotide, Eliquis, Cholecalciferol, and ondansetron.   Meds ordered this encounter  Medications  . fluconazole (DIFLUCAN) 150 MG tablet    Sig: Take 1 tablet (150 mg total) by mouth once for 1 dose. Take one tablet PO once. If sxs persist, may take one tablet PO 3 days later.    Dispense:  2 tablet    Refill:  1    Order Specific Question:   Supervising Provider    Answer:   TULLO, TERESA L [[id number]]  . mupirocin ointment (BACTROBAN) 2 %    Sig: Place 1 application into the nose 2 (two) times daily.    Dispense:  22 g    Refill:  0    Order Specific Question:   Supervising Provider    Answer:   Crecencio Mc [[id number]]    Return precautions given.   Risks, benefits, and  alternatives of the medications and treatment plan prescribed today were discussed, and patient expressed understanding.   Education regarding symptom management and diagnosis given to patient on AVS.  Continue to follow with Burnard Hawthorne, FNP for routine health maintenance.   Terri Cochran and I agreed with plan.   Mable Paris, FNP

## 2020-12-18 NOTE — Telephone Encounter (Signed)
Call pt Dr Terri Cochran reviewed CTA and didn't feel that fluid which was improving warranted further work up  I know she is having back pain, would she like to have xray here for other reasons for back pain ( musculoskeletal) or even a consult with orthopedics?  We can discuss in march at f/u as well

## 2020-12-18 NOTE — Assessment & Plan Note (Signed)
?   Folliculitis versus resolving abscess. Trial mupirocin.

## 2020-12-18 NOTE — Assessment & Plan Note (Addendum)
Upcoming appointment with hematology. Continue eliquis 5mg  bid. Will follow. With respiratory symptoms improving , patient preferred to wait to repeat CXR until follow up in March with me. We discussed need to repeat for resolution of pleural effusion and I agreed that appropriate to wait until our scheduled appointment.   CTA reviewed reviewed with supervising, Dr Deborra Medina, and she and I jointly agreed on management plan and seeing hematology.

## 2020-12-18 NOTE — Telephone Encounter (Signed)
-----   Message from Fredirick Maudlin, MD sent at 12/18/2020  1:31 PM EST ----- I just looked at the CT scans. The seroma from April is significantly improved. I don't think there is anything I would do for her at this time. Thanks! --JC ----- Message ----- From: Burnard Hawthorne, FNP Sent: 12/18/2020   1:28 PM EST To: Fredirick Maudlin, MD  Dr Celine Ahr,  Mutual patient here for follow up after recent diagnosis of PE and ED visit for Covid.  CTA 12/04/20 revealed: Small residual collections in the left upper quadrant, decreased in size from prior CT abdomen pelvis in April. These are favored to represent resolving postoperative seromas.  She continues to have left flank pain - not sure if related.   The CTA also notes : There is a stable small collection abutting the pancreatic tail measuring approximately 1.5 cm.   Do you you think either of the above findings warrant follow up with you ?  Thank you!  Joycelyn Schmid, NP

## 2020-12-18 NOTE — Assessment & Plan Note (Signed)
Empiric treatment with diflucan. She will let me know if symptoms do not resolve.

## 2020-12-18 NOTE — Assessment & Plan Note (Addendum)
I have sent message to Dr Celine Ahr regarding : There is a small residual fluid collection in the adrenalectomy bed, decreased in size from prior and  stable small collection abutting the pancreatic tail measuring approximately 1.5 cm. Dr Celine Ahr didn't feel that follow up needed with her.

## 2020-12-21 ENCOUNTER — Other Ambulatory Visit: Payer: Self-pay | Admitting: Family

## 2020-12-21 DIAGNOSIS — I2699 Other pulmonary embolism without acute cor pulmonale: Secondary | ICD-10-CM

## 2020-12-21 DIAGNOSIS — R109 Unspecified abdominal pain: Secondary | ICD-10-CM

## 2020-12-21 NOTE — Assessment & Plan Note (Signed)
Consulted with Dr Celine Ahr regarding residual fluid collection adrenalectomy bed whom felt no further follow up required.

## 2020-12-21 NOTE — Telephone Encounter (Signed)
Patient would like a referral to Orthopedics for back pain.

## 2020-12-25 ENCOUNTER — Encounter (HOSPITAL_COMMUNITY): Payer: Self-pay | Admitting: Hematology and Oncology

## 2020-12-25 ENCOUNTER — Other Ambulatory Visit: Payer: Self-pay

## 2020-12-25 ENCOUNTER — Inpatient Hospital Stay (HOSPITAL_COMMUNITY): Payer: 59 | Attending: Hematology and Oncology | Admitting: Hematology and Oncology

## 2020-12-25 DIAGNOSIS — Z8616 Personal history of COVID-19: Secondary | ICD-10-CM

## 2020-12-25 DIAGNOSIS — K760 Fatty (change of) liver, not elsewhere classified: Secondary | ICD-10-CM

## 2020-12-25 DIAGNOSIS — E041 Nontoxic single thyroid nodule: Secondary | ICD-10-CM | POA: Diagnosis not present

## 2020-12-25 DIAGNOSIS — I2699 Other pulmonary embolism without acute cor pulmonale: Secondary | ICD-10-CM

## 2020-12-25 DIAGNOSIS — J90 Pleural effusion, not elsewhere classified: Secondary | ICD-10-CM | POA: Diagnosis not present

## 2020-12-25 DIAGNOSIS — Z7901 Long term (current) use of anticoagulants: Secondary | ICD-10-CM

## 2020-12-25 DIAGNOSIS — I10 Essential (primary) hypertension: Secondary | ICD-10-CM

## 2020-12-25 NOTE — Progress Notes (Signed)
[place] CONSULT NOTE  Patient Care Team: Burnard Hawthorne, FNP as PCP - General (Family Medicine) Vidal Schwalbe Yvetta Coder, FNP as Nurse Practitioner (Family Medicine)  CHIEF COMPLAINTS/PURPOSE OF CONSULTATION:  PE  ASSESSMENT & PLAN:  No problem-specific Assessment & Plan notes found for this encounter.  No orders of the defined types were placed in this encounter.  This is a very pleasant 30 year old female patient with past medical history significant for depression recently found to have acute bilateral PE who was in late December 2021 while she was in [place] visiting her family.  She was started on Eliquis which she denies any noncompliance with.  She most recently went back to the ER with worsening shortness of breath and was noted to have acute bilateral PE greatest within the left lower lobe, developing pulmonary infarcts in the left lower lobe and lingula likely causing the worsening shortness of breath.  She does not have her old medical records with her.  I do not believe this is a new clot while on anticoagulation, this is likely the same PE that was diagnosed 2 weeks prior in [place]. She denies any provoking factors except for COVID-19 infection which was diagnosed when she went to the ED and oral contraceptive intake. I have discussed that she continue with Eliquis for at least 3 to 6 months for anticoagulation.  Since this is her first episode of provoked PE, I do not believe she will need lifelong anticoagulation unless hypercoagulable work-up is suggestive of underlying clotting disorder.  She will bring his labs from [place] to see if she has already had his hypercoagulable work-up done.  She will return to clinic in 24months.  If we do not have any labs from [place] by that point, I think it is important to repeat the hypercoagulable work-up before she discontinues anticoagulation. She was advised to avoid any form of estrogen-based contraception. She expressed understanding  of the recommendations.  She needs to go to the nearest hospital with any worsening symptoms of PE such as chest pain, worsening shortness of breath, fevers and lower extremity swelling, calf tenderness.  She understands the risk of bleeding while she is on blood thinners. Thank you for consulting [place] in the care of this patient.  Please not hesitate to contact us with any additional questions or concerns.  HISTORY OF PRESENTING ILLNESS:  Terri Cochran 30 y.o. female is here because of PE  This is a very pleasant 30 year old female patient with a past medical history significant for depression, GERD, headaches, hypertension referred to hematology for evaluation and recommendations for anticoagulation.  [name] arrived to the appointment today by herself.  Back in December, around the holiday.  She had a cold and symptoms concerning for Covid infection, had a rapid test which was negative but about a week later she had some chest pain, pain in the back, shortness of breath and went to the emergency room reviewed.  She apparently had a CT with PE protocol and a lot of blood work which suggested bilateral PE and hence started on Eliquis.  Most recently she went back to the ER on John 14 2022 with some worsening shortness of breath had a repeat CT which showed bilateral pulmonary embolism greater in the left lower lobe, no CT evidence for right-sided heart strain, small left-sided pleural effusion, probable developing pulmonary infarcts in the left lower lobe and lingula and the ultrasound showing a thyroid nodule. She cannot tell if her shortness of breath has  improved since she has underlying shortness of breath.   She can definitely say that she does not have any chest pain or leg swelling. Rest of the 10 point review of systems reviewed and negative.  REVIEW OF SYSTEMS:   Constitutional: Denies fevers, chills or abnormal night sweats Eyes: Denies blurriness of vision, double vision or watery eyes Ears,  nose, mouth, throat, and face: Denies mucositis or sore throat Respiratory: As mentioned. Cardiovascular: Denies palpitation, chest discomfort or lower extremity swelling Gastrointestinal:  Denies nausea, heartburn or change in bowel habits Skin: Denies abnormal skin rashes Lymphatics: Denies new lymphadenopathy or easy bruising Neurological:Denies numbness, tingling or new weaknesses Behavioral/Psych: Mood is stable, no new changes  All other systems were reviewed with the patient and are negative.  MEDICAL HISTORY:  Past Medical History:  Diagnosis Date  . Anxiety   . Complication of anesthesia    WOKE UP DURING WISDOM TOOTH SURGERY  . Depression   . Deviated septum   . Fatty liver disease, nonalcoholic   . GERD (gastroesophageal reflux disease)   . Headache   . Hypertension    no meds taken  . PE (pulmonary thromboembolism) ([place])     SURGICAL HISTORY: Past Surgical History:  Procedure Laterality Date  . ADRENALECTOMY Left 01/24/2020   Procedure: Open Left ADRENALECTOMY;  Surgeon: Fredirick Maudlin, MD;  Location: [facility] ORS;  Service: General;  Laterality: Left;  . BREAST REDUCTION SURGERY    . NASAL SEPTUM SURGERY     X2  . TONSILLECTOMY    . WISDOM TOOTH EXTRACTION      SOCIAL HISTORY: Social History   Socioeconomic History  . Marital status: Divorced    Spouse name: Not on file  . Number of children: 0  . Years of education: Not on file  . Highest education level: Not on file  Occupational History  . Not on file  Tobacco Use  . Smoking status: Never Smoker  . Smokeless tobacco: Never Used  Vaping Use  . Vaping Use: Never used  Substance and Sexual Activity  . Alcohol use: Yes    Comment: occasionally  . Drug use: Never  . Sexual activity: Yes    Birth control/protection: None    Comment: last sex 24 July 20  Other Topics Concern  . Not on file  Social History Narrative   Married, Terri Cochran   Works in social work   Grew up in [place]   Parents decreased     Sister lives in [place] work- in Lex[employer]ographer ( like Radiographer, therapeutic)    Social Determinants of Radio broadcast assistant Strain: Not on [employer] Insecurity: Not on file  Transportation Needs: Not on file  Physical Activity: Not on file  Stress: Not on file  Social Connections: Not on file  Intimate Partner Violence: Not on file    FAMILY HISTORY: Family History  Problem Relation Age of Onset  . Heart attack Father   . Diabetes Sister   . Hyperlipidemia Sister   . Hypertension Sister   . Heart attack Paternal Aunt   . Heart disease Paternal Aunt   . Heart attack Paternal Aunt   . Heart disease Paternal Aunt   . Heart attack Paternal Aunt   . Heart disease Paternal Aunt   . Colon cancer Neg Hx   . Esophageal cancer Neg Hx   . Pancreatic cancer Neg Hx   . Stomach cancer Neg Hx   . Inflammatory  bowel disease Neg Hx   . Liver disease Neg Hx   . Rectal cancer Neg Hx   . Clotting disorder Neg Hx   . Bleeding Disorder Neg Hx     ALLERGIES:  has No Known Allergies.  MEDICATIONS:  Current Outpatient Medications  Medication Sig Dispense Refill  . albuterol (VENTOLIN HFA) 108 (90 Base) MCG/ACT inhaler INHALE 2 PUFFS BY MOUTH EVERY 6 HOURS AS NEEDED FOR WHEEZING AND SHORTNESS OF BREATH 8.5 g 1  . Cholecalciferol 1.25 MG (50000 UT) TABS 50,000 units PO qwk for 8 weeks. 8 tablet 0  . ELIQUIS 5 MG TABS tablet Take 1 tablet (5 mg total) by mouth 2 (two) times daily. 60 tablet 2  . linaclotide (LINZESS) 290 MCG CAPS capsule Take 1 capsule (290 mcg total) by mouth daily before breakfast. (Patient not taking: Reported on 12/18/2020) 30 capsule 2  . mupirocin ointment (BACTROBAN) 2 % Place 1 application into the nose 2 (two) times daily. 22 g 0  . ondansetron (ZOFRAN ODT) 4 MG disintegrating tablet Take 1 tablet (4 mg total) by mouth every 8 (eight) hours as needed for nausea or vomiting. (Patient not taking: Reported on 12/18/2020) 20 tablet 0   No current  facility-administered medications for this visit.     PHYSICAL EXAMINATION: ECOG PERFORMANCE STATUS: 1 - Symptomatic but completely ambulatory  There were no vitals filed for this visit. There were no vitals filed for this visit.  GENERAL:alert, no distress and comfortable SKIN: skin color, texture, turgor are normal, no rashes or significant lesions EYES: normal, conjunctiva are pink and non-injected, sclera clear OROPHARYNX:no exudate, no erythema and lips, buccal mucosa, and tongue normal  NECK: supple, thyroid normal size, non-tender, without nodularity LYMPH:  no palpable lymphadenopathy in the cervical, axillary or inguinal LUNGS: clear to auscultation and percussion with normal breathing effort HEART: regular rate & rhythm and no murmurs and no lower extremity edema ABDOMEN:abdomen soft, non-tender and normal bowel sounds Musculoskeletal:no cyanosis of digits and no clubbing. No LE swelling noted PSYCH: alert & oriented x 3 with fluent speech NEURO: no focal motor/sensory deficits  LABORATORY DATA:  I have reviewed the data as listed Lab Results  Component Value Date   WBC 7.2 12/04/2020   HGB 12.1 12/04/2020   HCT 37.5 12/04/2020   MCV 80.3 12/04/2020   PLT 351 12/04/2020     Chemistry      Component Value Date/Time   NA 133 (L) 12/04/2020 2044   K 3.5 12/04/2020 2044   CL 101 12/04/2020 2044   CO2 22 12/04/2020 2044   BUN 12 12/04/2020 2044   CREATININE 1.22 (H) 12/04/2020 2044      Component Value Date/Time   CALCIUM 9.2 12/04/2020 2044   ALKPHOS 50 12/04/2020 2044   AST 20 12/04/2020 2044   ALT 22 12/04/2020 2044   BILITOT 0.4 12/04/2020 2044       RADIOGRAPHIC STUDIES: I have personally reviewed the radiological images as listed and agreed with the findings in the report. DG Chest 2 View  Result Date: 11/30/2020 CLINICAL DATA:  Cough, shortness of breath, ongoing treatment for PE diagnosed 11/15/2020 EXAM: CHEST - 2 VIEW COMPARISON:  11/04/2019  FINDINGS: The heart size and mediastinal contours are within normal limits. Subtle heterogeneous airspace opacity of the left lung base, likely with a small left pleural effusion. The visualized skeletal structures are unremarkable. IMPRESSION: Subtle heterogeneous airspace opacity of the left lung base, likely with a small left pleural effusion. This may reflect  infection/aspiration or alternately be reactive to pulmonary infarction given recent diagnosis of pulmonary embolism. Comparison to prior examination demonstrating pulmonary embolism would be helpful. Electronically Signed   By: Eddie Candle M.D.   On: 11/30/2020 15:43   CT Angio Chest PE W and/or Wo Contrast  Result Date: 12/05/2020 CLINICAL DATA:  PE suspected.  Shortness of breath. EXAM: CT ANGIOGRAPHY CHEST WITH CONTRAST TECHNIQUE: Multidetector CT imaging of the chest was performed using the standard protocol during bolus administration of intravenous contrast. Multiplanar CT image reconstructions and MIPs were obtained to evaluate the vascular anatomy. CONTRAST:  137mL OMNIPAQUE IOHEXOL 350 MG/ML SOLN COMPARISON:  None. FINDINGS: Cardiovascular: Contrast injection is sufficient to demonstrate satisfactory opacification of the pulmonary arteries to the segmental level. Acute bilateral pulmonary emboli are noted. There are lobar, segmental and subsegmental on the left, greatest within the left lower lobe. The distribution is primarily segmental subsegmental on the right. There is no CT evidence for right-sided heart strain. There are no significant atherosclerotic changes of the thoracic aorta. There is no evidence for dissection or aneurysm. Mediastinum/Nodes: -- No mediastinal lymphadenopathy. -- No hilar lymphadenopathy. -- No axillary lymphadenopathy. -- No supraclavicular lymphadenopathy. --there is a 1.8 cm thyroid nodule in the isthmus. -  Unremarkable esophagus. Lungs/Pleura: There is a small left-sided pleural effusion. There is a  nonenhancing airspace opacity in the left lower lobe suspicious for a developing pulmonary infarct. There is a similar opacity in the lingula also concerning for pulmonary infarct. There is no pneumothorax. Upper Abdomen: Contrast bolus timing is not optimized for evaluation of the abdominal organs. There is a small residual fluid collection in the adrenalectomy bed, decreased in size from prior study in April. The collection currently measures approximately 2.3 cm. There is a stable small collection abutting the pancreatic tail measuring approximately 1.5 cm (axial series 4, image 95). Musculoskeletal: No chest wall abnormality. No bony spinal canal stenosis. Review of the MIP images confirms the above findings. IMPRESSION: 1. Acute bilateral pulmonary emboli, greatest within the left lower lobe. No CT evidence for right-sided heart strain. 2. Small left-sided pleural effusion. 3. Probable developing pulmonary infarcts in the left lower lobe and lingula. 4. A 1.8 cm thyroid nodule in the isthmus. Follow-up with an outpatient thyroid ultrasound is recommended.(Ref: J Am Coll Radiol. 2015 Feb;12(2): 143-50). 5. Small residual collections in the left upper quadrant, decreased in size from prior CT abdomen pelvis in April. These are favored to represent resolving postoperative seromas. These results were called by telephone at the time of interpretation on 12/05/2020 at 12:30 am to provider Damita Lack , who verbally acknowledged these results. Electronically Signed   By: Constance Holster M.D.   On: 12/05/2020 00:32   DG Chest Port 1 View  Result Date: 12/04/2020 CLINICAL DATA:  Shortness of breath EXAM: PORTABLE CHEST 1 VIEW COMPARISON:  None. FINDINGS: The heart size and mediastinal contours are within normal limits. Both lungs are clear. The visualized skeletal structures are unremarkable. IMPRESSION: No active disease. Electronically Signed   By: Prudencio Pair M.D.   On: 12/04/2020 20:57    All questions were  answered. The patient knows to call the clinic with any problems, questions or concerns. I spent 45 minutes in the care of this patient including H and P, review of records, counseling and coordination of care.     Benay Pike, MD 12/25/2020 12:16 PM

## 2020-12-29 ENCOUNTER — Ambulatory Visit: Payer: 59 | Admitting: Family Medicine

## 2020-12-31 ENCOUNTER — Ambulatory Visit (HOSPITAL_COMMUNITY): Admission: RE | Admit: 2020-12-31 | Payer: 59 | Source: Ambulatory Visit

## 2021-01-03 ENCOUNTER — Other Ambulatory Visit: Payer: Self-pay | Admitting: Family

## 2021-01-03 DIAGNOSIS — F32A Depression, unspecified: Secondary | ICD-10-CM

## 2021-01-03 DIAGNOSIS — F419 Anxiety disorder, unspecified: Secondary | ICD-10-CM

## 2021-01-06 ENCOUNTER — Ambulatory Visit: Payer: 59 | Admitting: Family

## 2021-01-22 ENCOUNTER — Ambulatory Visit (HOSPITAL_COMMUNITY): Payer: 59

## 2021-01-26 ENCOUNTER — Other Ambulatory Visit: Payer: Self-pay | Admitting: Family

## 2021-01-27 ENCOUNTER — Encounter: Payer: Self-pay | Admitting: Family

## 2021-01-28 ENCOUNTER — Other Ambulatory Visit: Payer: Self-pay | Admitting: Family

## 2021-02-05 ENCOUNTER — Encounter: Payer: Self-pay | Admitting: Family

## 2021-02-05 DIAGNOSIS — Z0289 Encounter for other administrative examinations: Secondary | ICD-10-CM

## 2021-03-24 ENCOUNTER — Ambulatory Visit (HOSPITAL_COMMUNITY): Payer: 59 | Admitting: Hematology

## 2021-04-02 ENCOUNTER — Ambulatory Visit (HOSPITAL_COMMUNITY): Payer: 59 | Admitting: Hematology and Oncology

## 2021-04-26 NOTE — Progress Notes (Signed)
[name]  Patient Care Team: Burnard Hawthorne, FNP as PCP - General (Family Medicine) Vidal Schwalbe, Yvetta Coder, FNP as Nurse Practitioner (Family Medicine)  ASSESSMENT & PLAN:   # Provoked Pulmonary Embolism --PE provoked by prolong flight, COVID infection, and estrogen containing birth control ( or a combination of all 3) --I have discussed that she should continue with anticoagulation for at least 3 to 6 months. --patient notes she is trying to get pregnant. Additionally she self discontinued Eliquis back in Feb 2022 because the medication ran out --recommend proceed with lovenox 1mg /kg BID to complete the treatment of this VTE (safest to use lovenox if pregnancy is desired). Continue x 3 months.  --no indication that she should require lifelong anticoagulation, unless she were found to have antiphospholipid antibody syndrome --RTC in 3 months time.  --She was advised to avoid any form of estrogen-based contraception. --She expressed understanding of the recommendations.  She needs to go to the nearest hospital with any worsening symptoms of PE such as chest pain, worsening shortness of breath, fevers and lower extremity swelling, calf tenderness.  She understands the risk of bleeding while she is on blood thinners.Marland Kitchen  HISTORY OF PRESENTING ILLNESS:  Terri Cochran 30 y.o. female is here for follow up of an unprovoked Pulmonary embolism.   On exam today Terri Cochran is accompanied by a friend.  She reports that she has not been taking her Eliquis therapy as she stopped taking it in February when she ran out.  She notes that she recently saw her primary care provider who represcribed her the medication noting that "do not get pregnant".  She notes that she is not having any chest pain, shortness of breath, lower extremity swelling, or other signs or symptoms concerning for recurrent clot.  Unfortunately the patient is currently interested in becoming pregnant and wants to  discontinue the Eliquis therapy.  She does not want to wait for the 64-month.  Wants to get pregnant "right now".  As she recently got married.  She was also recently diagnosed with hidradenitis suppurativa and surgery was recommended, however she does not think she will go through with this.  She is asking for a blood thinner to help treat her pulmonary embolism without affecting pregnancy.  She otherwise denies any fevers, chills, sweats, nausea, vomiting or diarrhea.  A full 10 point ROS is listed below.  REVIEW OF SYSTEMS:   Constitutional: Denies fevers, chills or abnormal night sweats Eyes: Denies blurriness of vision, double vision or watery eyes Ears, nose, mouth, throat, and face: Denies mucositis or sore throat Respiratory: As mentioned. Cardiovascular: Denies palpitation, chest discomfort or lower extremity swelling Gastrointestinal:  Denies nausea, heartburn or change in bowel habits Skin: Denies abnormal skin rashes Lymphatics: Denies new lymphadenopathy or easy bruising Neurological:Denies numbness, tingling or new weaknesses Behavioral/Psych: Mood is stable, no new changes  All other systems were reviewed with the patient and are negative.  MEDICAL HISTORY:  Past Medical History:  Diagnosis Date  . Anxiety   . Complication of anesthesia    WOKE UP DURING WISDOM TOOTH SURGERY  . Depression   . Deviated septum   . Fatty liver disease, nonalcoholic   . GERD (gastroesophageal reflux disease)   . Headache   . Hypertension    no meds taken  . PE (pulmonary thromboembolism) ([place])     SURGICAL HISTORY: Past Surgical History:  Procedure Laterality Date  . ADRENALECTOMY Left 01/24/2020   Procedure: Open Left ADRENALECTOMY;  Surgeon: Fredirick Maudlin, MD;  Location: [facility] ORS;  Service: General;  Laterality: Left;  . BREAST REDUCTION SURGERY    . NASAL SEPTUM SURGERY     X2  . TONSILLECTOMY    . WISDOM TOOTH EXTRACTION      SOCIAL HISTORY: Social History   Socioeconomic  History  . Marital status: Divorced    Spouse name: Not on file  . Number of children: 0  . Years of education: Not on file  . Highest education level: Not on file  Occupational History  . Not on file  Tobacco Use  . Smoking status: Never Smoker  . Smokeless tobacco: Never Used  Vaping Use  . Vaping Use: Never used  Substance and Sexual Activity  . Alcohol use: Yes    Comment: occasionally  . Drug use: Never  . Sexual activity: Yes    Birth control/protection: None    Comment: last sex 24 July 20  Other Topics Concern  . Not on file  Social History Narrative   Married, Terri Cochran   Works in social work   Grew up in [place]   Parents decreased    Sister lives in [place] work- in supervision ( like Radiographer, therapeutic)    Social Determinants of [place] Strain: [place]   . Difficulty of Paying Living Expenses: Not very hard  Food Insecurity: No Food Insecurity  . Worried About Charity fundraiser in the Last Year: Never true  . Ran Out of Food in the Last Year: Never true  Transportation Needs: No Transportation Needs  . Lack of Transportation (Medical): No  . Lack of Transportation (Non-Medical): No  Physical Activity: Inactive  . Days of Exercise per Week: 0 days  . Minutes of Exercise per Session: 0 min  Stress: Stress Concern Present  . Feeling of Stress : Very much  Social Connections: Moderately Isolated  . Frequency of Communication with Friends and Family: Twice a week  . Frequency of Social Gatherings with Friends and Family: Twice a week  . Attends Religious Services: 1 to 4 times per year  . Active Member of Clubs or Organizations: No  . Attends Archivist Meetings: Never  . Marital Status: Never married  Intimate Partner Violence: Not At Risk  . Fear of Current or Ex-Partner: No  . Emotionally Abused: No  . Physically Abused: No  . Sexually Abused: No    FAMILY HISTORY: Family History  Problem Relation Age of  Onset  . Heart attack Father   . Diabetes Sister   . Hyperlipidemia Sister   . Hypertension Sister   . Heart attack Paternal Aunt   . Heart disease Paternal Aunt   . Heart attack Paternal Aunt   . Heart disease Paternal Aunt   . Heart attack Paternal Aunt   . Heart disease Paternal Aunt   . Colon cancer Neg Hx   . Esophageal cancer Neg Hx   . Pancreatic cancer Neg Hx   . Stomach cancer Neg Hx   . Inflammatory bowel disease Neg Hx   . Liver disease Neg Hx   . Rectal cancer Neg Hx   . Clotting disorder Neg Hx   . Bleeding Disorder Neg Hx     ALLERGIES:  has No Known Allergies.  MEDICATIONS:  Current Outpatient Medications  Medication Sig Dispense Refill  . diphenhydramine-acetaminophen (TYLENOL PM) 25-500 MG TABS tablet Take 1 tablet by mouth at bedtime as  needed.    . enoxaparin (LOVENOX) 100 MG/ML injection Inject 1 mL (100 mg total) into the skin every 12 (twelve) hours. 60 mL 2  . linaclotide (LINZESS) 290 MCG CAPS capsule Take 1 capsule (290 mcg total) by mouth daily before breakfast. (Patient not taking: Reported on 04/27/2021) 30 capsule 2   No current facility-administered medications for this visit.     PHYSICAL EXAMINATION: ECOG PERFORMANCE STATUS: 1 - Symptomatic but completely ambulatory  Vitals:   04/27/21 1113  BP: 109/76  Pulse: 76  Resp: 18  Temp: 98.6 F (37 C)  SpO2: 100%   Filed Weights   04/27/21 1113  Weight: 237 lb 1.6 oz (107.5 kg)    GENERAL:well appearing middle aged [place] American female. alert, no distress and comfortable SKIN: skin color, texture, turgor are normal, no rashes or significant lesions EYES: normal, conjunctiva are pink and non-injected, sclera clear LUNGS: clear to auscultation and percussion with normal breathing effort HEART: regular rate & rhythm and no murmurs and no lower extremity edema PSYCH: alert & oriented x 3 with fluent speech NEURO: no focal motor/sensory deficits  LABORATORY DATA:  I have reviewed the  data as listed Lab Results  Component Value Date   WBC 7.2 12/04/2020   HGB 12.1 12/04/2020   HCT 37.5 12/04/2020   MCV 80.3 12/04/2020   PLT 351 12/04/2020     Chemistry      Component Value Date/Time   NA 133 (L) 12/04/2020 2044   K 3.5 12/04/2020 2044   CL 101 12/04/2020 2044   CO2 22 12/04/2020 2044   BUN 12 12/04/2020 2044   CREATININE 1.22 (H) 12/04/2020 2044      Component Value Date/Time   CALCIUM 9.2 12/04/2020 2044   ALKPHOS 50 12/04/2020 2044   AST 20 12/04/2020 2044   ALT 22 12/04/2020 2044   BILITOT 0.4 12/04/2020 2044       RADIOGRAPHIC STUDIES: I have personally reviewed the radiological images as listed and agreed with the findings in the report. No results found.  All questions were answered. The patient knows to call the clinic with any problems, questions or concerns. I spent 30 minutes in the care of this patient including H and P, review of records, counseling and coordination of care.  Ledell Peoples, MD Department of Hematology/Oncology [place] [facility] Phone: [phone] Pager: [phone] Email: [email or web address]

## 2021-04-27 ENCOUNTER — Other Ambulatory Visit: Payer: Self-pay

## 2021-04-27 ENCOUNTER — Inpatient Hospital Stay (HOSPITAL_COMMUNITY): Payer: 59 | Attending: Hematology and Oncology | Admitting: Hematology and Oncology

## 2021-04-27 VITALS — BP 109/76 | HR 76 | Temp 98.6°F | Resp 18 | Wt 237.1 lb

## 2021-04-27 DIAGNOSIS — I10 Essential (primary) hypertension: Secondary | ICD-10-CM | POA: Diagnosis not present

## 2021-04-27 DIAGNOSIS — I2699 Other pulmonary embolism without acute cor pulmonale: Secondary | ICD-10-CM | POA: Diagnosis not present

## 2021-04-27 MED ORDER — ENOXAPARIN SODIUM 100 MG/ML IJ SOSY: 100.0000 mg | PREFILLED_SYRINGE | Freq: Two times a day (BID) | INTRAMUSCULAR | 2 refills | Status: DC

## 2021-05-07 ENCOUNTER — Other Ambulatory Visit: Payer: Self-pay | Admitting: Family

## 2021-05-07 ENCOUNTER — Other Ambulatory Visit: Payer: Self-pay | Admitting: Gastroenterology

## 2021-05-07 DIAGNOSIS — L0291 Cutaneous abscess, unspecified: Secondary | ICD-10-CM

## 2021-05-15 ENCOUNTER — Encounter: Payer: Self-pay | Admitting: Family

## 2021-05-17 ENCOUNTER — Other Ambulatory Visit: Payer: Self-pay

## 2021-06-09 ENCOUNTER — Ambulatory Visit: Payer: 59 | Admitting: Family

## 2021-08-02 ENCOUNTER — Other Ambulatory Visit (HOSPITAL_COMMUNITY): Payer: Self-pay | Admitting: *Deleted

## 2021-08-02 DIAGNOSIS — I2699 Other pulmonary embolism without acute cor pulmonale: Secondary | ICD-10-CM

## 2021-08-03 ENCOUNTER — Inpatient Hospital Stay (HOSPITAL_COMMUNITY): Payer: 59

## 2021-08-04 ENCOUNTER — Ambulatory Visit (HOSPITAL_COMMUNITY): Payer: 59

## 2021-08-04 ENCOUNTER — Other Ambulatory Visit (HOSPITAL_COMMUNITY): Payer: 59

## 2021-08-10 ENCOUNTER — Ambulatory Visit (HOSPITAL_COMMUNITY): Payer: 59 | Admitting: Hematology

## 2021-08-10 ENCOUNTER — Encounter: Payer: Self-pay | Admitting: General Surgery

## 2021-08-26 ENCOUNTER — Ambulatory Visit (HOSPITAL_COMMUNITY): Admission: RE | Admit: 2021-08-26 | Payer: 59 | Source: Ambulatory Visit

## 2021-08-26 ENCOUNTER — Inpatient Hospital Stay (HOSPITAL_COMMUNITY): Payer: 59 | Attending: Hematology

## 2021-09-02 ENCOUNTER — Inpatient Hospital Stay (HOSPITAL_COMMUNITY): Payer: 59 | Attending: Hematology

## 2021-09-02 ENCOUNTER — Ambulatory Visit (HOSPITAL_COMMUNITY): Payer: 59 | Admitting: Physician Assistant

## 2021-09-10 ENCOUNTER — Ambulatory Visit (HOSPITAL_COMMUNITY): Payer: 59 | Admitting: Physician Assistant

## 2021-09-16 NOTE — Progress Notes (Signed)
[place], [place] [address]   CLINIC:  Medical Oncology/Hematology  PCP:  Burnard Hawthorne, FNP [address]  [id number] [phone]   REASON FOR VISIT:  Follow-up for provoked pulmonary embolism  PRIOR THERAPY: Eliquis  CURRENT THERAPY: Lovenox  INTERVAL HISTORY:  [name] 30 y.o. female returns for routine follow-up of her previously diagnosed pulmonary embolism.  She was last seen by Dr. Lorenso Courier on 04/27/2021.  At today's visit, she reports feeling overall somewhat poorly.  No recent hospitalizations, surgeries, or changes in baseline health status.  She reports that she was fairly compliant with her Lovenox, and completed her 3 months of treatment with approximately 3 total missed doses.  Her Lovenox ran out approximately 1 month ago and she has not had any anticoagulation since that time.  She did not have any bleeding issues while on Lovenox.    She reports that she continues to have chest tightness, chest pain, intermittent shortness of breath, chest numbness, and numbness that spreads down her bilateral arms.  She reports that the symptoms first started when she was diagnosed with PE in January 2022, but that they have been progressive since that time.  She reports that she is also gained some weight which she thinks might be contributing.  She reports an episode of slight hemoptysis last month with blood-streaking of her phlegm while she was sick.  She reports bilateral leg edema and numbness after sitting for too long but denies any unilateral leg swelling, pain, or erythema.  She reports that she is still trying to get pregnant, although she recalls being advised by previous hematologist to avoid pregnancy for the time being.  She has 50% energy and 100% appetite. She endorses that she is maintaining a stable weight.   REVIEW OF SYSTEMS:  Review of Systems  Constitutional:  Positive for fatigue. Negative  for appetite change, chills, diaphoresis, fever and unexpected weight change.  HENT:   Negative for lump/mass and nosebleeds.   Eyes:  Negative for eye problems.  Respiratory:  Positive for shortness of breath. Negative for cough and hemoptysis.   Cardiovascular:  Positive for chest pain. Negative for leg swelling and palpitations.  Gastrointestinal:  Positive for constipation and nausea. Negative for abdominal pain, blood in stool, diarrhea and vomiting.  Genitourinary:  Negative for hematuria.   Musculoskeletal:  Positive for arthralgias and back pain.  Skin: Negative.   Neurological:  Positive for dizziness, headaches and numbness. Negative for light-headedness.  Hematological:  Does not bruise/bleed easily.  Psychiatric/Behavioral:  Positive for sleep disturbance. The patient is nervous/anxious.      PAST MEDICAL/SURGICAL HISTORY:  Past Medical History:  Diagnosis Date   Anxiety    Complication of anesthesia    WOKE UP DURING WISDOM TOOTH SURGERY   Depression    Deviated septum    Fatty liver disease, nonalcoholic    GERD (gastroesophageal reflux disease)    Headache    Hypertension    no meds taken   PE (pulmonary thromboembolism) ([place])    Past Surgical History:  Procedure Laterality Date   ADRENALECTOMY Left 01/24/2020   Procedure: Open Left ADRENALECTOMY;  Surgeon: Fredirick Maudlin, MD;  Location: [facility] ORS;  Service: General;  Laterality: Left;   BREAST REDUCTION SURGERY     NASAL SEPTUM SURGERY     X2   TONSILLECTOMY     WISDOM TOOTH EXTRACTION       SOCIAL HISTORY:  Social History  Socioeconomic History   Marital status: Divorced    Spouse name: Not on file   Number of children: 0   Years of education: Not on file   Highest education level: Not on file  Occupational History   Not on file  Tobacco Use   Smoking status: Never   Smokeless tobacco: Never  Vaping Use   Vaping Use: Never used  Substance and Sexual Activity   Alcohol use: Yes    Comment:  occasionally   Drug use: Never   Sexual activity: Yes    Birth control/protection: None    Comment: last sex 24 July 20  Other Topics Concern   Not on file  Social History Narrative   Married, Sonia Side   Works in social work   Grew up in [place]   Parents decreased    Sister lives in [place] work- in Lexicographer ( like Radiographer, therapeutic)    Social Determinants of Radio broadcast assistant Strain: Low Risk    Difficulty of Paying Living Expenses: Not very hard  Food Insecurity: No Food Insecurity   Worried About Charity fundraiser in the Last Year: Never true   Arboriculturist in the Last Year: Never true  Transportation Needs: No Transportation Needs   Lack of Transportation (Medical): No   Lack of Transportation (Non-Medical): No  Physical Activity: Inactive   Days of Exercise per Week: 0 days   Minutes of Exercise per Session: 0 min  Stress: Stress Concern Present   Feeling of Stress : Very much  Social Connections: Moderately Isolated   Frequency of Communication with Friends and Family: Twice a week   Frequency of Social Gatherings with Friends and Family: Twice a week   Attends Religious Services: 1 to 4 times per year   Active Member of [employer] or Organizations: No   Attends Music therapist: Never   Marital Status: Never married  Human resources officer Violence: Not At Risk   Fear of Current or Ex-Partner: No   Emotionally Abused: No   Physically Abused: No   Sexually Abused: No    FAMILY HISTORY:  Family History  Problem Relation Age of Onset   Heart attack Father    Diabetes Sister    Hyperlipidemia Sister    Hypertension Sister    Heart attack Paternal Aunt    Heart disease Paternal Aunt    Heart attack Paternal Aunt    Heart disease Paternal Aunt    Heart attack Paternal Aunt    Heart disease Paternal Aunt    Colon cancer Neg Hx    Esophageal cancer Neg Hx    Pancreatic cancer Neg Hx    Stomach cancer Neg Hx    Inflammatory bowel  disease Neg Hx    Liver disease Neg Hx    Rectal cancer Neg Hx    Clotting disorder Neg Hx    Bleeding Disorder Neg Hx     CURRENT MEDICATIONS:  Outpatient Encounter Medications as of 09/17/2021  Medication Sig   diphenhydramine-acetaminophen (TYLENOL PM) 25-500 MG TABS tablet Take 1 tablet by mouth at bedtime as needed.   enoxaparin (LOVENOX) 100 MG/ML injection Inject 1 mL (100 mg total) into the skin every 12 (twelve) hours.   linaclotide (LINZESS) 290 MCG CAPS capsule Take 1 capsule (290 mcg total) by mouth daily before breakfast. (Patient not taking: Reported on 04/27/2021)   No facility-administered encounter medications on file as of 09/17/2021.  ALLERGIES:  No Known Allergies   PHYSICAL EXAM:  ECOG PERFORMANCE STATUS: 1 - Symptomatic but completely ambulatory  There were no vitals filed for this visit. There were no vitals filed for this visit. Physical Exam Constitutional:      Appearance: Normal appearance. She is obese.  HENT:     Head: Normocephalic and atraumatic.     Mouth/Throat:     Mouth: Mucous membranes are moist.  Eyes:     Extraocular Movements: Extraocular movements intact.     Pupils: Pupils are equal, round, and reactive to light.  Cardiovascular:     Rate and Rhythm: Normal rate and regular rhythm.     Pulses: Normal pulses.     Heart sounds: Normal heart sounds.  Pulmonary:     Effort: Pulmonary effort is normal.     Breath sounds: Normal breath sounds.  Abdominal:     General: Bowel sounds are normal.     Palpations: Abdomen is soft.     Tenderness: There is no abdominal tenderness.  Musculoskeletal:        General: No swelling.     Right lower leg: No edema.     Left lower leg: No edema.  Lymphadenopathy:     Cervical: No cervical adenopathy.  Skin:    General: Skin is warm and dry.  Neurological:     General: No focal deficit present.     Mental Status: She is alert and oriented to person, place, and time.  Psychiatric:         Mood and Affect: Mood normal.        Behavior: Behavior normal.     LABORATORY DATA:  I have reviewed the labs as listed.  CBC    Component Value Date/Time   WBC 7.2 12/04/2020 2044   RBC 4.67 12/04/2020 2044   HGB 12.1 12/04/2020 2044   HCT 37.5 12/04/2020 2044   PLT 351 12/04/2020 2044   MCV 80.3 12/04/2020 2044   MCH 25.9 (L) 12/04/2020 2044   MCHC 32.3 12/04/2020 2044   RDW 13.9 12/04/2020 2044   LYMPHSABS 0.4 (L) 12/04/2020 2044   MONOABS 0.6 12/04/2020 2044   EOSABS 0.0 12/04/2020 2044   BASOSABS 0.0 12/04/2020 2044   CMP Latest Ref Rng & Units 12/04/2020 11/30/2020 01/26/2020  Glucose 70 - 99 mg/dL 101(H) 117(H) 110(H)  BUN 6 - 20 mg/dL 12 14 7   Creatinine 0.44 - 1.00 mg/dL 1.22(H) 0.84 0.99  Sodium 135 - 145 mmol/L 133(L) 137 136  Potassium 3.5 - 5.1 mmol/L 3.5 4.4 3.8  Chloride 98 - 111 mmol/L 101 104 101  CO2 22 - 32 mmol/L 22 24 27   Calcium 8.9 - 10.3 mg/dL 9.2 9.3 8.2(L)  Total Protein 6.5 - 8.1 g/dL 7.8 7.3 -  Total Bilirubin 0.3 - 1.2 mg/dL 0.4 0.3 -  Alkaline Phos 38 - 126 U/L 50 50 -  AST 15 - 41 U/L 20 16 -  ALT 0 - 44 U/L 22 30 -    DIAGNOSTIC IMAGING:  I have independently reviewed the relevant imaging and discussed with the patient.  ASSESSMENT & PLAN: 1.  Provoked Pulmonary Embolism - Patient found to have pulmonary embolism while visiting family in [place] (December 2021) - Return to ED in [place] 2 weeks later with worsening shortness of breath, and CTA chest showed what was most likely the same PE that she had been diagnosed with in [place] - CTA chest (12/04/2020): Acute bilateral pulmonary emboli, greatest within the left  lower lobe with probable developing pulmonary infarcts - PE provoked by prolong flight, COVID infection, and estrogen containing birth control (or a combination of all three factors) - Patient was initially on Eliquis, but stopped taking it in February 2022 after running out of medication. - Prescription sent for Lovenox after  hematology visit with Dr. Lorenso Courier (04/27/2021) - use of Lovenox based on patient's desire to become pregnant, which is a safer option than Lovenox in pregnant populations. - Patient reports that she was compliant with completing Lovenox 1 mg/kg BID x3 months - reports that her last Lovenox was taken approximately 1 month ago - She continues to have intermittent chest pain, chest tightness, chest numbness that spreads down her bilateral arms, and some shortness of breath, which she reports has been progressive since she was initially diagnosed with PE in January 2022 - Labs obtained by PCP (09/06/2021) show normal CBC with Hgb 12.7 - Hypercoagulable work-up has not been performed - PLAN: Due to continued symptoms of chest pain and shortness of breath as described above, recommend that the patient continue on Lovenox until we have ruled out any residual pulmonary embolism. - Refill prescription for Lovenox 1 mg/kg twice daily sent to pharmacy. - CTA chest ordered to be performed in outpatient setting - Since patient is actively receiving fertility treatment and trying to become pregnant, I have informed patient that she will need to visit with her fertility doctor BEFORE CTA chest is performed in order to ensure negative pregnancy status.  She is instructed to fax these results to our office once obtained and also to bring them with to her CTA chest appointment. - Phone visit in 3 to 4 weeks to discuss results of CTA chest. - If CTA chest is negative, will continue Lovenox and check hypercoagulable panel to better assess need for lifelong anticoagulation.  At this time there is no strong indication for lifelong anticoagulation unless she is found to have hypercoagulable state or recurrent unprovoked blood clots. - She is continuing to avoid any form of estrogen-based contraception. - I have once again advised the patient that it would be safest to avoid actively trying to become pregnant until she has  completed treatment for her blood clots and we have been able to assess her risk for recurrent blood clots in the future. - She expressed understanding that she needs to go to the nearest hospital with any worsening symptoms of PE such as chest pain, worsening shortness of breath, fevers, and lower extremity swelling or calf tenderness.     PLAN SUMMARY & DISPOSITION: - Refill prescription has been sent for Lovenox - CTA chest in the next 1 to 2 weeks AFTER confirming negative pregnancy status with fertility doctor (patient reports that she has an appointment with her fertility doctor next Friday, 09/24/2021). - Phone visit after CTA chest to discuss results and next steps  All questions were answered. The patient knows to call the clinic with any problems, questions or concerns.  Medical decision making: Moderate  Time spent on visit: I spent 25 minutes counseling the patient face to face. The total time spent in the appointment was 40 minutes and more than 50% was on counseling.   Harriett Rush, PA-C  09/17/2021 11:45 AM

## 2021-09-17 ENCOUNTER — Emergency Department (HOSPITAL_COMMUNITY): Payer: 59

## 2021-09-17 ENCOUNTER — Emergency Department (HOSPITAL_COMMUNITY)
Admission: EM | Admit: 2021-09-17 | Discharge: 2021-09-17 | Disposition: A | Discharge status: Home / Self Care | Payer: 59 | Attending: Emergency Medicine | Admitting: Emergency Medicine

## 2021-09-17 ENCOUNTER — Encounter (HOSPITAL_COMMUNITY): Payer: Self-pay | Admitting: *Deleted

## 2021-09-17 ENCOUNTER — Other Ambulatory Visit: Payer: Self-pay

## 2021-09-17 ENCOUNTER — Inpatient Hospital Stay (HOSPITAL_BASED_OUTPATIENT_CLINIC_OR_DEPARTMENT_OTHER): Payer: 59 | Admitting: Physician Assistant

## 2021-09-17 VITALS — BP 111/79 | HR 103 | Temp 98.4°F | Resp 18 | Wt 245.3 lb

## 2021-09-17 DIAGNOSIS — R42 Dizziness and giddiness: Secondary | ICD-10-CM | POA: Insufficient documentation

## 2021-09-17 DIAGNOSIS — R0789 Other chest pain: Secondary | ICD-10-CM | POA: Diagnosis not present

## 2021-09-17 DIAGNOSIS — N9489 Other specified conditions associated with female genital organs and menstrual cycle: Secondary | ICD-10-CM | POA: Diagnosis not present

## 2021-09-17 DIAGNOSIS — R202 Paresthesia of skin: Secondary | ICD-10-CM | POA: Insufficient documentation

## 2021-09-17 DIAGNOSIS — R0602 Shortness of breath: Secondary | ICD-10-CM | POA: Diagnosis not present

## 2021-09-17 DIAGNOSIS — Z8616 Personal history of COVID-19: Secondary | ICD-10-CM | POA: Insufficient documentation

## 2021-09-17 DIAGNOSIS — I10 Essential (primary) hypertension: Secondary | ICD-10-CM | POA: Insufficient documentation

## 2021-09-17 DIAGNOSIS — R079 Chest pain, unspecified: Secondary | ICD-10-CM

## 2021-09-17 DIAGNOSIS — R0600 Dyspnea, unspecified: Secondary | ICD-10-CM

## 2021-09-17 DIAGNOSIS — I2699 Other pulmonary embolism without acute cor pulmonale: Secondary | ICD-10-CM | POA: Diagnosis not present

## 2021-09-17 LAB — CBC WITH DIFFERENTIAL/PLATELET
Abs Immature Granulocytes: 0.01 10*3/uL (ref 0.00–0.07)
Basophils Absolute: 0 10*3/uL (ref 0.0–0.1)
Basophils Relative: 0 %
Eosinophils Absolute: 0.2 10*3/uL (ref 0.0–0.5)
Eosinophils Relative: 2 %
HCT: 35.9 % — ABNORMAL LOW (ref 36.0–46.0)
Hemoglobin: 11.8 g/dL — ABNORMAL LOW (ref 12.0–15.0)
Immature Granulocytes: 0 %
Lymphocytes Relative: 30 %
Lymphs Abs: 2.2 10*3/uL (ref 0.7–4.0)
MCH: 27.1 pg (ref 26.0–34.0)
MCHC: 32.9 g/dL (ref 30.0–36.0)
MCV: 82.3 fL (ref 80.0–100.0)
Monocytes Absolute: 0.6 10*3/uL (ref 0.1–1.0)
Monocytes Relative: 7 %
Neutro Abs: 4.4 10*3/uL (ref 1.7–7.7)
Neutrophils Relative %: 61 %
Platelets: 330 10*3/uL (ref 150–400)
RBC: 4.36 MIL/uL (ref 3.87–5.11)
RDW: 14.9 % (ref 11.5–15.5)
WBC: 7.4 10*3/uL (ref 4.0–10.5)
nRBC: 0 % (ref 0.0–0.2)

## 2021-09-17 LAB — BASIC METABOLIC PANEL
Anion gap: 9 (ref 5–15)
BUN: 13 mg/dL (ref 6–20)
CO2: 21 mmol/L — ABNORMAL LOW (ref 22–32)
Calcium: 9 mg/dL (ref 8.9–10.3)
Chloride: 104 mmol/L (ref 98–111)
Creatinine, Ser: 0.94 mg/dL (ref 0.44–1.00)
GFR, Estimated: >60 mL/min (ref >60)
Glucose, Bld: 77 mg/dL (ref 70–99)
Potassium: 3.9 mmol/L (ref 3.5–5.1)
Sodium: 134 mmol/L — ABNORMAL LOW (ref 135–145)

## 2021-09-17 LAB — TROPONIN I (HIGH SENSITIVITY)
Troponin I (High Sensitivity): <2 ng/L (ref <18)
Troponin I (High Sensitivity): 2 ng/L (ref <18)

## 2021-09-17 LAB — I-STAT BETA HCG BLOOD, ED (MC, WL, AP ONLY): I-stat hCG, quantitative: <5 m[IU]/mL (ref <5)

## 2021-09-17 LAB — BRAIN NATRIURETIC PEPTIDE: B Natriuretic Peptide: 3 pg/mL (ref 0.0–100.0)

## 2021-09-17 MED ORDER — ENOXAPARIN SODIUM 100 MG/ML IJ SOSY: 100.0000 mg | PREFILLED_SYRINGE | Freq: Two times a day (BID) | INTRAMUSCULAR | 2 refills | Status: DC

## 2021-09-17 MED ORDER — IOHEXOL 350 MG/ML SOLN
150.0000 mL | Freq: Once | INTRAVENOUS | Status: AC | PRN
  Administered 2021-09-17: 150 mL via INTRAVENOUS

## 2021-09-17 NOTE — Discharge Instructions (Addendum)
  Resume the Lovenox shots as planned.  Follow-up with the hematology/oncology provider for further care and treatment.  The CAT scan did show bilateral PEs that are small and likely residual from your prior PEs.  This will require ongoing treatment with Lovenox, and further work-up and treatment by the provider that you saw today.  There is no other indication for acute problems with your vasculature or brain.

## 2021-09-17 NOTE — ED Provider Notes (Signed)
3:20 PM-checkout from Dr. Kathrynn Humble to evaluate patient after imaging, CTA chest, query PE.  She is currently anticoagulated for pulmonary emboli.  He also ordered CT angiogram of the neck and head because of left upper extremity numbness.  5:20 PM-I have discussed CTA chest, with the radiologist who read it, which shows residual bilateral PEs, much smaller than prior.  I have discussed the CT angiogram neck and head with the radiologist who read it.  There was no plain head CT done before contrast administration.  There are no vascular abnormalities of the neck or head.  5:55 PM-updated findings with the patient.  She is comfortable states she does not have any numbness in her left arm at this time.  We discussed need for resuming Lovenox therapy as planned by her oncologist with follow-up.   Daleen Bo, MD 09/17/21 1800

## 2021-09-17 NOTE — ED Notes (Signed)
Pt refuses IV placement with blood draw.

## 2021-09-17 NOTE — ED Provider Notes (Signed)
[facility] EMERGENCY DEPARTMENT Provider Note   CSN: [id number] Arrival date & time: 09/17/21  1144     History Chief Complaint  Patient presents with   Chest Pain    Terri Cochran is a 30 y.o. female.  HPI     30 year old female comes in with chief complaint of chest pain, left-sided upper extremity numbness, dizziness.  Patient is currently undergoing IUI.  She was diagnosed with PE last year and was on Eliquis.  Thereafter she was switched to Lovenox and she finished 3 months of course.  Patient reports that she continues to have chest discomfort.  Chest discomfort is described as burning type sensation over the mid chest.  She takes Tums and her pain gets better.  Her pain does radiate sometimes to the left side, scapular region.  She is also having shortness of breath.  Shortness of breath is unchanged since last January.  Sometimes the shortness of breath is worse than normal.  She has considered anxiety as the etiology herself.  She is also complaining of some left-sided numbness described as " something pulling on her shoulder/muscle".  She occasionally has pins and needle sensation.  Symptoms are not new, but they are lasting longer this episode, and the current episode started yesterday.  Finally, patient also reports some dizziness that is intermittent.  She was seen by oncology clinic today.  They had ordered outpatient CT scan.  Patient is coming to the ER because of other week symptoms that the oncology team were not able to address, and she would prefer getting CT scan while here.  Past Medical History:  Diagnosis Date   Anxiety    Complication of anesthesia    WOKE UP DURING WISDOM TOOTH SURGERY   Depression    Deviated septum    Fatty liver disease, nonalcoholic    GERD (gastroesophageal reflux disease)    Headache    Hypertension    no meds taken   PE (pulmonary thromboembolism) ([place])     Patient Active Problem List   Diagnosis Date Noted   Abscess  12/18/2020   Thyroid nodule 12/18/2020   Pulmonary embolism ([place]) 11/30/2020   Dizziness 09/14/2020   Left shoulder pain 09/14/2020   Screening for STD (sexually transmitted disease) 09/14/2020   Anxiety and depression 06/12/2020   Depression, recurrent ([place]) 06/12/2020   Chronic insomnia 06/12/2020   Stress 06/12/2020   Abnormal CT scan 03/21/2020   Chronic bilateral lower abdominal pain 02/19/2020   History of Teratoma 02/19/2020   BRBPR (bright red blood per rectum) 02/19/2020   Chronic constipation 02/19/2020   Non-intractable vomiting 02/19/2020   Abnormal MRI, liver 02/19/2020   Benign teratoma of adrenal gland, 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   Vaginal bleeding 02/04/2020   Benign mass of left adrenal gland ([place]) 01/24/2020   Change in stool 11/29/2019   Fatty liver 11/25/2019   Vaginal discharge 11/11/2019   COVID-19 11/11/2019   Gastroesophageal reflux disease 11/11/2019   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   Adjustment reaction with anxiety and depression 11/01/2019   Left flank pain 11/01/2019   Encounter for preconception consultation 11/01/2019    Past Surgical History:  Procedure Laterality Date   ADRENALECTOMY Left 01/24/2020   Procedure: Open Left ADRENALECTOMY;  Surgeon: Fredirick Maudlin, MD;  Location: [facility] ORS;  Service: General;  Laterality: Left;   BREAST REDUCTION SURGERY     NASAL SEPTUM SURGERY     X2   TONSILLECTOMY     WISDOM TOOTH EXTRACTION  OB History     Gravida  1   Para      Term      Preterm      AB      Living         SAB      IAB      Ectopic      Multiple      Live Births              Family History  Problem Relation Age of Onset   Heart attack Father    Diabetes Sister    Hyperlipidemia Sister    Hypertension Sister    Heart attack Paternal Aunt    Heart disease Paternal Aunt    Heart attack Paternal Aunt    Heart disease Paternal Aunt    Heart attack Paternal Aunt    Heart disease Paternal Aunt     Colon cancer Neg Hx    Esophageal cancer Neg Hx    Pancreatic cancer Neg Hx    Stomach cancer Neg Hx    Inflammatory bowel disease Neg Hx    Liver disease Neg Hx    Rectal cancer Neg Hx    Clotting disorder Neg Hx    Bleeding Disorder Neg Hx     Social History   Tobacco Use   Smoking status: Never   Smokeless tobacco: Never  Vaping Use   Vaping Use: Never used  Substance Use Topics   Alcohol use: Yes    Comment: occasionally   Drug use: Never    Home Medications Prior to Admission medications   Medication Sig Start Date End Date Taking? Authorizing Provider  Cholecalciferol (D3-1000) 25 MCG (1000 UT) tablet Take 1 tablet by mouth daily. 04/14/21 04/14/22 Yes [provider]  diphenhydramine-acetaminophen (TYLENOL PM) 25-500 MG TABS tablet Take 1 tablet by mouth at bedtime as needed.   Yes [provider]  enoxaparin (LOVENOX) 100 MG/ML injection Inject 1 mL (100 mg total) into the skin every 12 (twelve) hours. 09/17/21  Yes Pennington, Rebekah M, PA-C  metFORMIN (GLUCOPHAGE-XR) 500 MG 24 hr tablet Take 500 mg by mouth daily. 06/15/21  Yes [provider]  progesterone 200 MG SUPP Place 200 mg vaginally at bedtime.   Yes [provider]  linaclotide Rolan Lipa) 290 MCG CAPS capsule Take 1 capsule (290 mcg total) by mouth daily before breakfast. Patient not taking: No sig reported 05/01/20   Mansouraty, Telford Nab., MD    Allergies    Patient has no known allergies.  Review of Systems   Review of Systems  Constitutional:  Positive for activity change.  Respiratory:  Positive for shortness of breath.   Cardiovascular:  Positive for chest pain.  Neurological:  Positive for dizziness.  Hematological:  Does not bruise/bleed easily.  All other systems reviewed and are negative.  Physical Exam Updated Vital Signs BP 113/66   Pulse 87   Temp 97.8 F (36.6 C) (Oral)   Resp 20   Ht 5\' 7"  (1.702 m)   Wt 111.8 kg   SpO2 100%   BMI 38.60 kg/m    Physical Exam Vitals and nursing note reviewed.  Constitutional:      Appearance: She is well-developed.  HENT:     Head: Atraumatic.  Neck:     Comments: No meningismus Cardiovascular:     Rate and Rhythm: Normal rate.  Pulmonary:     Effort: Pulmonary effort is normal.  Musculoskeletal:     Cervical back:  Normal range of motion and neck supple.     Right lower leg: No edema.     Left lower leg: No edema.  Skin:    General: Skin is warm and dry.  Neurological:     General: No focal deficit present.     Mental Status: She is alert and oriented to person, place, and time.     Motor: No weakness.     Comments: Patient is reporting some left-sided subjective numbness over the upper extremity on    ED Results / Procedures / Treatments   Labs (all labs ordered are listed, but only abnormal results are displayed) Labs Reviewed  CBC WITH DIFFERENTIAL/PLATELET  BASIC METABOLIC PANEL  BRAIN NATRIURETIC PEPTIDE  TROPONIN I (HIGH SENSITIVITY)    EKG EKG Interpretation  Date/Time:  Friday September 17 2021 12:05:03 EDT Ventricular Rate:  81 PR Interval:  176 QRS Duration: 93 QT Interval:  379 QTC Calculation: 440 R Axis:   73 Text Interpretation: Sinus rhythm No acute changes No significant change since last tracing Confirmed by Varney Biles [phone]) on 09/17/2021 1:44:58 PM  Radiology No results found.  Procedures Procedures   Medications Ordered in ED Medications - No data to display  ED Course  I have reviewed the triage vital signs and the nursing notes.  Pertinent labs & imaging results that were available during my care of the patient were reviewed by me and considered in my medical decision making (see chart for details).    MDM Rules/Calculators/A&P                           30 year old female comes in with chief complaint of chest pain, shortness of breath, left-sided numbness.  Her symptoms are not new, they have been present for at least a year,  but rather are more pronounced or lasting longer.  She was on Lovenox for PE which was discontinued a month ago.  She is trying to get pregnant and is getting hormones for it.  She has an outpatient CT chest ordered, will we will get one right now.  With her left upper extremity numbness, that I cannot explain and is waxing and waning in intensity, we will get CT angio neck and head to make sure there is no intracranial or extracranial thrombosis.  Patient had COVID-19 x2.  It appears that the PE came about after the first COVID-19 episode.  If the work-up here is negative, then we will advised that she follows up with PCP.  Final Clinical Impression(s) / ED Diagnoses Final diagnoses:  None    Rx / DC Orders ED Discharge Orders     None        Varney Biles, MD 09/17/21 1358

## 2021-09-17 NOTE — Patient Instructions (Addendum)
[place] at [facility] Discharge Instructions  You were seen today by Tarri Abernethy PA-C for your history of pulmonary embolism.    Due to your ongoing symptoms of chest pain and shortness of breath, we will schedule you for an outpatient CTA of your chest in 2 weeks.  However, before your CTA chest, it is EXTREMELY IMPORTANT that you follow-up with your fertility doctor to ensure that you are not pregnant.  Once you have negative pregnancy test results faxed to our office, we will approve you to go forward with CTA chest.  In the meantime, we will continue Lovenox until we have confirmed that any blood clots in your chest have resolved and that there are not any new blood clots.  We will follow-up in approximately 3 to 4 weeks via telephone visit to discuss results of CTA chest and next steps.   Thank you for choosing [place] at [facility] to provide your oncology and hematology care.  To afford each patient quality time with our provider, please arrive at least 15 minutes before your scheduled appointment time.   If you have a lab appointment with the [facility] please come in thru the Main Entrance and check in at the main information desk.  You need to re-schedule your appointment should you arrive 10 or more minutes late.  We strive to give you quality time with our providers, and arriving late affects you and other patients whose appointments are after yours.  Also, if you no show three or more times for appointments you may be dismissed from the clinic at the providers discretion.     Again, thank you for choosing [facility].  Our hope is that these requests will decrease the amount of time that you wait before being seen by our physicians.       _____________________________________________________________  Should you have questions after your visit to [facility], please contact our office at [phone] and follow the prompts.  Our office hours are 8:00 a.m. and 4:30 p.m. Monday - Friday.  Please note that voicemails left after 4:00 p.m. may not be returned until the following business day.  We are closed weekends and major holidays.  You do have access to a nurse 24-7, just call the main number to the clinic [phone] and do not press any options, hold on the line and a nurse will answer the phone.    For prescription refill requests, have your pharmacy contact our office and allow 72 hours.    Due to Covid, you will need to wear a mask upon entering the hospital. If you do not have a mask, a mask will be given to you at the Main Entrance upon arrival. For doctor visits, patients may have 1 support person age 64 or older with them. For treatment visits, patients can not have anyone with them due to social distancing guidelines and our immunocompromised population.

## 2021-09-17 NOTE — ED Triage Notes (Signed)
Numbness in left arm onset last night, pain in chest, history of PE

## 2021-09-17 NOTE — ED Notes (Signed)
Pt ambulated to the bathroom unassisted.  

## 2021-09-23 ENCOUNTER — Ambulatory Visit (HOSPITAL_COMMUNITY): Payer: 59

## 2021-09-27 NOTE — Progress Notes (Signed)
Virtual Visit via Telephone Note [facility]  I connected with Terri Cochran  on 09/28/21  at  3:41 PM  by telephone and verified that I am speaking with the correct person using two identifiers.  Location: Patient: Home Provider: [facility]   I discussed the limitations, risks, security and privacy concerns of performing an evaluation and management service by telephone and the availability of in person appointments. I also discussed with the patient that there may be a patient responsible charge related to this service. The patient expressed understanding and agreed to proceed.   HISTORY OF PRESENT ILLNESS: [name] follows at our clinic for pulmonary embolism.  Patient was last evaluated by Terri Abernethy PA-C on 09/17/2021.  After her last visit, she presented to the ED due to dyspnea and was found to have residual PE on CTA chest (09/17/2021) with nonocclusive filling defect in a left lower lobe segmental pulmonary artery and right lower lobe pulmonary artery, but with overall clot burden markedly decreased since prior study from 12/04/2020.  Patient reports that her symptoms improved after she stopped taking progesterone last week.  She reports resolution of her chest pain, chest tightness, and chest numbness that radiated down her bilateral arms.  She continues to have some baseline shortness of breath related to chronic respiratory issues, but denies any new or worsening respiratory symptoms.  She reports bilateral lower extremity edema after she sits for too long, relieved by elevating legs.  She is continue to follow with her fertility specialist and is undergoing IUI treatment, which she plans to continue at this time.  She has not yet restarted Lovenox, as she reports she has had some difficulty obtaining it from her pharmacy.  She is curious about possible natural solutions to her blood clots.  She has 75% energy and 75% appetite.  She  endorses that she is maintaining a stable weight at this time.    OBSERVATIONS/OBJECTIVE: Review of Systems  Constitutional:  Negative for chills, diaphoresis, fever, malaise/fatigue and weight loss.  Respiratory:  Positive for shortness of breath. Negative for cough.   Cardiovascular:  Positive for leg swelling. Negative for chest pain and palpitations.  Gastrointestinal:  Negative for abdominal pain, blood in stool, melena, nausea and vomiting.  Neurological:  Negative for dizziness and headaches.    PHYSICAL EXAM (per limitations of virtual telephone visit): The patient is alert and oriented x 3, exhibiting adequate mentation, good mood, and ability to speak in full sentences and execute sound judgement.   ASSESSMENT & PLAN: 1.  Provoked pulmonary embolism - Patient found to have pulmonary embolism while visiting family in [place] (December 2021) - Return to ED in [place] 2 weeks later with worsening shortness of breath, and CTA chest showed what was most likely the same PE that she had been diagnosed with in [place] - CTA chest (12/04/2020): Acute bilateral pulmonary emboli, greatest within the left lower lobe with probable developing pulmonary infarcts - PE provoked by prolong flight, COVID infection, and estrogen-containing birth control (or a combination of all three factors) - Patient was initially on Eliquis, but stopped taking it in February 2022 after running out of medication. - Prescription sent for Lovenox after hematology visit with Dr. Lorenso Courier (04/27/2021) - use of Lovenox based on patient's desire to become pregnant, which is a safer option than Lovenox in pregnant populations.  (She is currently seeing fertility specialist for IUI treatment.) - Patient reports that she was mostly compliant with  completing Lovenox 1 mg/kg BID x3 months - reports that her last Lovenox was taken approximately 1 month ago - Repeat CTA chest obtained due to persistent symptoms (09/17/2021) showed  nonocclusive filling defect in a left lower lobe segmental pulmonary artery and right lower lobe pulmonary artery, but with overall clot burden markedly decreased since prior study from 12/04/2020. - Residual clot is likely due to failure to complete initial 3 months of treatment  - She continues to have intermittent chest pain, chest tightness, chest numbness that spreads down her bilateral arms, and some shortness of breath, which she reports has been progressive since she was initially diagnosed with PE in January 2022 - she reports that this improved after she stopped taking her progesterone - Labs obtained by PCP (09/06/2021) show normal CBC with Hgb 12.7 - Hypercoagulable work-up has not yet been performed - Discussed with patient that there are no natural anticoagulant treatments that have proven efficacy in validated medical studies.  However, I encouraged her to continue to pursue lifestyle modifications such as improved nutrition and exercise, which she has already started on her own in an effort to lose weight and "get healthy" - PLAN: Residual clot likely due to failure to complete initial 3 months of treatment. - Continue Lovenox x3 months (patient instructed to NOT stop Lovenox until she has received direct instructions from hematology team) - After 3 months, we will check D-dimer and coagulopathy panel - If persistent symptoms or elevated D-dimer, we will check another CTA chest   FOLLOW UP INSTRUCTIONS: Lovenox x3 months. Labs in 3 months (D-dimer, coagulopathy panel). RTC the week after labs.    I discussed the assessment and treatment plan with the patient. The patient was provided an opportunity to ask questions and all were answered. The patient agreed with the plan and demonstrated an understanding of the instructions.   The patient was advised to call back or seek an in-person evaluation if the symptoms worsen or if the condition fails to improve as anticipated.  I provided  23 minutes of non-face-to-face time during this encounter.   Terri Rush, PA-C 09/28/21 5:08 PM

## 2021-09-28 ENCOUNTER — Encounter (HOSPITAL_COMMUNITY): Payer: Self-pay | Admitting: Physician Assistant

## 2021-09-28 ENCOUNTER — Encounter: Payer: Self-pay | Admitting: Hematology and Oncology

## 2021-09-28 ENCOUNTER — Inpatient Hospital Stay (HOSPITAL_COMMUNITY): Payer: 59 | Attending: Hematology | Admitting: Physician Assistant

## 2021-09-28 ENCOUNTER — Other Ambulatory Visit: Payer: Self-pay

## 2021-09-28 DIAGNOSIS — I2699 Other pulmonary embolism without acute cor pulmonale: Secondary | ICD-10-CM

## 2021-09-28 MED ORDER — ENOXAPARIN SODIUM 100 MG/ML IJ SOSY: 100.0000 mg | PREFILLED_SYRINGE | Freq: Two times a day (BID) | INTRAMUSCULAR | 3 refills | Status: DC

## 2021-09-29 ENCOUNTER — Encounter (HOSPITAL_COMMUNITY): Payer: Self-pay | Admitting: *Deleted

## 2021-12-19 ENCOUNTER — Encounter (HOSPITAL_COMMUNITY): Payer: Self-pay

## 2021-12-19 ENCOUNTER — Other Ambulatory Visit: Payer: Self-pay

## 2021-12-19 ENCOUNTER — Emergency Department (HOSPITAL_COMMUNITY)

## 2021-12-19 ENCOUNTER — Emergency Department (HOSPITAL_COMMUNITY)
Admission: EM | Admit: 2021-12-19 | Discharge: 2021-12-19 | Disposition: A | Discharge status: Home / Self Care | Attending: Emergency Medicine | Admitting: Emergency Medicine

## 2021-12-19 DIAGNOSIS — N83201 Unspecified ovarian cyst, right side: Secondary | ICD-10-CM | POA: Diagnosis not present

## 2021-12-19 DIAGNOSIS — N83202 Unspecified ovarian cyst, left side: Secondary | ICD-10-CM | POA: Insufficient documentation

## 2021-12-19 DIAGNOSIS — R1031 Right lower quadrant pain: Secondary | ICD-10-CM | POA: Diagnosis present

## 2021-12-19 DIAGNOSIS — R102 Pelvic and perineal pain: Secondary | ICD-10-CM

## 2021-12-19 LAB — CBC
HCT: 35.9 % — ABNORMAL LOW (ref 36.0–46.0)
Hemoglobin: 11.4 g/dL — ABNORMAL LOW (ref 12.0–15.0)
MCH: 27 pg (ref 26.0–34.0)
MCHC: 31.8 g/dL (ref 30.0–36.0)
MCV: 84.9 fL (ref 80.0–100.0)
Platelets: 397 10*3/uL (ref 150–400)
RBC: 4.23 MIL/uL (ref 3.87–5.11)
RDW: 15.3 % (ref 11.5–15.5)
WBC: 7.5 10*3/uL (ref 4.0–10.5)
nRBC: 0 % (ref 0.0–0.2)

## 2021-12-19 LAB — URINALYSIS, ROUTINE W REFLEX MICROSCOPIC
Bilirubin Urine: NEGATIVE
Glucose, UA: NEGATIVE mg/dL
Ketones, ur: NEGATIVE mg/dL
Leukocytes,Ua: NEGATIVE
Nitrite: NEGATIVE
Protein, ur: NEGATIVE mg/dL
Specific Gravity, Urine: >1.03 — ABNORMAL HIGH (ref 1.005–1.030)
pH: 6 (ref 5.0–8.0)

## 2021-12-19 LAB — URINALYSIS, MICROSCOPIC (REFLEX)

## 2021-12-19 LAB — COMPREHENSIVE METABOLIC PANEL
ALT: 20 U/L (ref 0–44)
AST: 21 U/L (ref 15–41)
Albumin: 3.7 g/dL (ref 3.5–5.0)
Alkaline Phosphatase: 39 U/L (ref 38–126)
Anion gap: 4 — ABNORMAL LOW (ref 5–15)
BUN: 11 mg/dL (ref 6–20)
CO2: 23 mmol/L (ref 22–32)
Calcium: 8.8 mg/dL — ABNORMAL LOW (ref 8.9–10.3)
Chloride: 107 mmol/L (ref 98–111)
Creatinine, Ser: 0.92 mg/dL (ref 0.44–1.00)
GFR, Estimated: >60 mL/min (ref >60)
Glucose, Bld: 134 mg/dL — ABNORMAL HIGH (ref 70–99)
Potassium: 3.4 mmol/L — ABNORMAL LOW (ref 3.5–5.1)
Sodium: 134 mmol/L — ABNORMAL LOW (ref 135–145)
Total Bilirubin: 0.1 mg/dL — ABNORMAL LOW (ref 0.3–1.2)
Total Protein: 7.3 g/dL (ref 6.5–8.1)

## 2021-12-19 LAB — LIPASE, BLOOD: Lipase: 29 U/L (ref 11–51)

## 2021-12-19 LAB — POC URINE PREG, ED: Preg Test, Ur: NEGATIVE

## 2021-12-19 MED ORDER — IOHEXOL 300 MG/ML  SOLN
100.0000 mL | Freq: Once | INTRAMUSCULAR | Status: AC | PRN
  Administered 2021-12-19: 100 mL via INTRAVENOUS

## 2021-12-19 NOTE — ED Provider Notes (Addendum)
[facility] EMERGENCY DEPARTMENT Provider Note   CSN: [id number] Arrival date & time: 12/19/21  1921     History  No chief complaint on file.   Terri Cochran is a 31 y.o. female.  HPI 31 year old female presents with right lower quadrant abdominal pain.  She states that on 118 she had ovarian egg retrieval for IVF.  She was doing okay but then about 3 days ago started getting intermittent pain in her lower abdomen, right greater than left.  Pain seems to come and go but was at its worst tonight and was a 10 out of 10.  It is now down to about a 6 or 7.  No vomiting but she has felt nauseated.  No urinary symptoms.  She has started to have her menstrual cycle but denies any vaginal discharge.  No back pain or fevers.  Home Medications Prior to Admission medications   Medication Sig Start Date End Date Taking? Authorizing Provider  Cholecalciferol (D3-1000) 25 MCG (1000 UT) tablet Take 1 tablet by mouth daily. 04/14/21 04/14/22  [provider]  diphenhydramine-acetaminophen (TYLENOL PM) 25-500 MG TABS tablet Take 1 tablet by mouth at bedtime as needed.    [provider]  enoxaparin (LOVENOX) 100 MG/ML injection Inject 1 mL (100 mg total) into the skin every 12 (twelve) hours. 09/28/21   Harriett Rush, PA-C  linaclotide (LINZESS) 290 MCG CAPS capsule Take 1 capsule (290 mcg total) by mouth daily before breakfast. 05/01/20   Mansouraty, Telford Nab., MD  metFORMIN (GLUCOPHAGE-XR) 500 MG 24 hr tablet Take 500 mg by mouth daily. 06/15/21   [provider]  oxyCODONE-acetaminophen (PERCOCET/ROXICET) 5-325 MG tablet Take 1 tablet by mouth every 4 (four) hours as needed. 12/08/21   [provider]  progesterone 200 MG SUPP Place 200 mg vaginally at bedtime.    [provider]      Allergies    Patient has no known allergies.    Review of Systems   Review of Systems  Constitutional:  Negative for fever.  Gastrointestinal:  Positive for abdominal  pain and nausea. Negative for vomiting.  Genitourinary:  Positive for vaginal bleeding. Negative for dysuria and vaginal discharge.  Musculoskeletal:  Negative for back pain.   Physical Exam Updated Vital Signs BP 109/72    Pulse 77    Temp 98 F (36.7 C) (Oral)    Resp 20    Ht 5\' 6"  (1.676 m)    Wt 111.1 kg    LMP 12/15/2021 (Exact Date)    SpO2 98%    BMI 39.54 kg/m  Physical Exam Vitals and nursing note reviewed.  Constitutional:      Appearance: She is well-developed.  HENT:     Head: Normocephalic and atraumatic.  Cardiovascular:     Rate and Rhythm: Normal rate and regular rhythm.     Heart sounds: Normal heart sounds.  Pulmonary:     Effort: Pulmonary effort is normal.     Breath sounds: Normal breath sounds.  Abdominal:     Palpations: Abdomen is soft.     Tenderness: There is abdominal tenderness in the right lower quadrant and left lower quadrant.     Comments: Well healed abdominal surgical scars RLQ>LLQ tenderness  Skin:    General: Skin is warm and dry.  Neurological:     Mental Status: She is alert.    ED Results / Procedures / Treatments   Labs (all labs ordered are listed, but only abnormal results are displayed)  Labs Reviewed  COMPREHENSIVE METABOLIC PANEL - Abnormal; Notable for the following components:      Result Value   Sodium 134 (*)    Potassium 3.4 (*)    Glucose, Bld 134 (*)    Calcium 8.8 (*)    Total Bilirubin 0.1 (*)    Anion gap 4 (*)    All other components within normal limits  CBC - Abnormal; Notable for the following components:   Hemoglobin 11.4 (*)    HCT 35.9 (*)    All other components within normal limits  URINALYSIS, ROUTINE W REFLEX MICROSCOPIC - Abnormal; Notable for the following components:   Specific Gravity, Urine >1.030 (*)    Hgb urine dipstick MODERATE (*)    All other components within normal limits  URINALYSIS, MICROSCOPIC (REFLEX) - Abnormal; Notable for the following components:   Bacteria, UA FEW (*)     Trichomonas, UA PRESENT (*)    All other components within normal limits  LIPASE, BLOOD  POC URINE PREG, ED    EKG None  Radiology US PELVIS (TRANSABDOMINAL ONLY)  Result Date: 12/19/2021 CLINICAL DATA:  Right lower quadrant pain for several days, history of infertility EXAM: TRANSABDOMINAL ULTRASOUND OF PELVIS DOPPLER ULTRASOUND OF OVARIES TECHNIQUE: Transabdominal ultrasound examination of the pelvis was performed including evaluation of the uterus, ovaries, adnexal regions, and pelvic cul-de-sac. Transvaginal imaging was not performed due to underlying patient discomfort. Color and duplex Doppler ultrasound was utilized to evaluate blood flow to the ovaries. COMPARISON:  CT from earlier in the same day. FINDINGS: Uterus Measurements: 9.4 x 5.3 x 5.2 cm. = volume: 137 mL. No fibroids or other mass visualized. Endometrium Thickness: 8 mm.  No focal abnormality visualized. Right ovary Measurements: 9.5 x 6.5 x 10.6 cm. = volume: 342 mL. Multiple cysts are noted within the right ovary with intervening thin septi. Largest of these measures 4.4 cm. Small area of thickening is noted which measures approximately 9 mm and demonstrates some mild vascularity. This likely represents some intervening normal ovarian tissue surrounded by cystic change. Left ovary Measurements: 6.6 x 3.4 x 6.7 cm. = volume: 98 mL. Multiple cysts are noted the largest of which measures 2.7 cm. Pulsed Doppler evaluation demonstrates normal low-resistance arterial and venous waveforms in both ovaries. Other: None IMPRESSION: Cystic changes in the ovaries bilaterally with enlargement of the right ovary similar to that seen on recent CT examination. These appear simple in nature largest of which measures 4.4 cm on the right. Given the clinical history of infertility and likely stimulation medications, a short-term follow-up in 6 weeks is recommended to assess for stability/resolution. No other focal abnormality noted. Electronically  Signed   By: Inez Catalina M.D.   On: 12/19/2021 22:56   CT ABDOMEN PELVIS W CONTRAST  Result Date: 12/19/2021 CLINICAL DATA:  Right lower quadrant abdominal pain. EXAM: CT ABDOMEN AND PELVIS WITH CONTRAST TECHNIQUE: Multidetector CT imaging of the abdomen and pelvis was performed using the standard protocol following bolus administration of intravenous contrast. RADIATION DOSE REDUCTION: This exam was performed according to the departmental dose-optimization program which includes automated exposure control, adjustment of the mA and/or kV according to patient size and/or use of iterative reconstruction technique. CONTRAST:  179mL OMNIPAQUE IOHEXOL 300 MG/ML  SOLN COMPARISON:  CT abdomen and pelvis 02/28/2020. FINDINGS: Lower chest: No acute abnormality. Hepatobiliary: The liver is mildly enlarged, a new finding. No focal liver lesions are identified. Gallbladder and bile ducts are within normal limits. Pancreas: Unremarkable. No pancreatic ductal dilatation  or surrounding inflammatory changes. Spleen: Normal in size without focal abnormality. Adrenals/Urinary Tract: Patient is status post left adrenalectomy. Previously identified fluid collection in the left adrenal bed has significantly decreased in size and now measures 1.3 by 1.0 cm. Right adrenal glands, kidneys, and bladder are within normal limits. Stomach/Bowel: Stomach is within normal limits. Appendix appears normal. No evidence of bowel wall thickening, distention, or inflammatory changes. Vascular/Lymphatic: No significant vascular findings are present. No enlarged abdominal or pelvic lymph nodes. Reproductive: Uterus is within normal limits. There is a 7.0 x 6.2 by 10.0 cm complex cystic mass in the central pelvis, likely abutting the right ovary. Additionally, there is a 6.2 x 5.0 by 4.3 cm complex cystic mass in the left adnexa likely related to the left ovary. Other: There is a fat containing hernia in the anterior left abdominal wall which is new  from prior examination. This hernia contains nondilated bowel. There is a fat containing upper abdominal ventral hernia. Subcutaneous densities in the anterior abdominal wall likely represent medication injection sites. There is no ascites or free air. There are prominent bilateral inguinal lymph nodes. There is an enlarged right inguinal lymph node measuring 12 mm short axis. Musculoskeletal: No acute or significant osseous findings. IMPRESSION: 1. There are bilateral cystic adnexal masses measuring 10 cm on the right and 6.2 cm on the left. These are likely ovarian in origin. Malignancy not excluded. Recommend further evaluation with ultrasound or MRI. 2. Appendix is within normal limits. 3. Left abdominal wall hernia containing nondilated bowel. 4. New hepatomegaly. Electronically Signed   By: Ronney Asters M.D.   On: 12/19/2021 21:38   US PELVIC DOPPLER (TORSION R/O OR MASS ARTERIAL FLOW)  Result Date: 12/19/2021 CLINICAL DATA:  Right lower quadrant pain for several days, history of infertility EXAM: TRANSABDOMINAL ULTRASOUND OF PELVIS DOPPLER ULTRASOUND OF OVARIES TECHNIQUE: Transabdominal ultrasound examination of the pelvis was performed including evaluation of the uterus, ovaries, adnexal regions, and pelvic cul-de-sac. Transvaginal imaging was not performed due to underlying patient discomfort. Color and duplex Doppler ultrasound was utilized to evaluate blood flow to the ovaries. COMPARISON:  CT from earlier in the same day. FINDINGS: Uterus Measurements: 9.4 x 5.3 x 5.2 cm. = volume: 137 mL. No fibroids or other mass visualized. Endometrium Thickness: 8 mm.  No focal abnormality visualized. Right ovary Measurements: 9.5 x 6.5 x 10.6 cm. = volume: 342 mL. Multiple cysts are noted within the right ovary with intervening thin septi. Largest of these measures 4.4 cm. Small area of thickening is noted which measures approximately 9 mm and demonstrates some mild vascularity. This likely represents some  intervening normal ovarian tissue surrounded by cystic change. Left ovary Measurements: 6.6 x 3.4 x 6.7 cm. = volume: 98 mL. Multiple cysts are noted the largest of which measures 2.7 cm. Pulsed Doppler evaluation demonstrates normal low-resistance arterial and venous waveforms in both ovaries. Other: None IMPRESSION: Cystic changes in the ovaries bilaterally with enlargement of the right ovary similar to that seen on recent CT examination. These appear simple in nature largest of which measures 4.4 cm on the right. Given the clinical history of infertility and likely stimulation medications, a short-term follow-up in 6 weeks is recommended to assess for stability/resolution. No other focal abnormality noted. Electronically Signed   By: Inez Catalina M.D.   On: 12/19/2021 22:56    Procedures Procedures    Medications Ordered in ED Medications  iohexol (OMNIPAQUE) 300 MG/ML solution 100 mL (100 mLs Intravenous Contrast Given 12/19/21  2116)    ED Course/ Medical Decision Making/ A&P                           Medical Decision Making Amount and/or Complexity of Data Reviewed Labs: ordered. Radiology: ordered. ECG/medicine tests: ordered.  Risk Prescription drug management.   Patient declines pain medicine. Ultimately I think these ovarian cysts are the cause of her pain. However she appears comfortable at this time. U/S shows no torsion. Labs overall benign. I think she is stable for outpatient f/u with her GYN and repeat US as recommended by radiology. No infectious symptoms.   I have reviewed her labs, normal WBC and chronic/stable anemia. Lipase normal.   CT abd/pelvis images reviewed by myself, has sizable ovarian masses.   Chart reviewed but her GYN/IVF records are not in our system.   Appears stable for d/c, will give return precautions.         Final Clinical Impression(s) / ED Diagnoses Final diagnoses:  Cysts of both ovaries    Rx / DC Orders ED Discharge Orders      None         Sherwood Gambler, MD 12/20/21 0040    Sherwood Gambler, MD 12/20/21 [phone]

## 2021-12-19 NOTE — ED Triage Notes (Signed)
Pt c/o RLQ pain that has been going on for the last three days. Pt states she had an egg retrieval a week ago for IVF. Does not know if its related, but the pain has gotten worse since and wants to be checked out.

## 2021-12-19 NOTE — ED Notes (Signed)
Pt back from CT

## 2021-12-19 NOTE — Discharge Instructions (Addendum)
Your CT scan and ultrasound show large ovarian cysts. Call your OBGYN tomorrow (1/30) for further evaluation and repeat ultrasound  If you develop worsening, continued, or recurrent abdominal pain, uncontrolled vomiting, fever, chest or back pain, or any other new/concerning symptoms then return to the ER for evaluation.

## 2021-12-20 ENCOUNTER — Telehealth (HOSPITAL_BASED_OUTPATIENT_CLINIC_OR_DEPARTMENT_OTHER): Payer: Self-pay | Admitting: Emergency Medicine

## 2021-12-20 NOTE — Telephone Encounter (Signed)
I noted that after patient left, I reviewed her UA and there was trichomonas present. I attempted to call her and left her a voicemail to call back.

## 2022-01-04 ENCOUNTER — Encounter (HOSPITAL_COMMUNITY): Payer: Self-pay

## 2022-01-04 ENCOUNTER — Inpatient Hospital Stay (HOSPITAL_COMMUNITY): Attending: Hematology

## 2022-01-18 ENCOUNTER — Ambulatory Visit (HOSPITAL_COMMUNITY): Payer: 59 | Admitting: Physician Assistant

## 2022-03-31 ENCOUNTER — Emergency Department (HOSPITAL_COMMUNITY)

## 2022-03-31 ENCOUNTER — Encounter (HOSPITAL_COMMUNITY): Payer: Self-pay | Admitting: *Deleted

## 2022-03-31 ENCOUNTER — Emergency Department (HOSPITAL_COMMUNITY)
Admission: EM | Admit: 2022-03-31 | Discharge: 2022-03-31 | Disposition: A | Discharge status: Home / Self Care | Attending: Emergency Medicine | Admitting: Emergency Medicine

## 2022-03-31 ENCOUNTER — Other Ambulatory Visit: Payer: Self-pay

## 2022-03-31 DIAGNOSIS — I10 Essential (primary) hypertension: Secondary | ICD-10-CM | POA: Diagnosis not present

## 2022-03-31 DIAGNOSIS — Z79899 Other long term (current) drug therapy: Secondary | ICD-10-CM | POA: Insufficient documentation

## 2022-03-31 DIAGNOSIS — O209 Hemorrhage in early pregnancy, unspecified: Secondary | ICD-10-CM | POA: Diagnosis present

## 2022-03-31 DIAGNOSIS — Z3A01 Less than 8 weeks gestation of pregnancy: Secondary | ICD-10-CM | POA: Insufficient documentation

## 2022-03-31 DIAGNOSIS — O4690 Antepartum hemorrhage, unspecified, unspecified trimester: Secondary | ICD-10-CM

## 2022-03-31 LAB — URINALYSIS, ROUTINE W REFLEX MICROSCOPIC
Bilirubin Urine: NEGATIVE
Glucose, UA: NEGATIVE mg/dL
Ketones, ur: NEGATIVE mg/dL
Leukocytes,Ua: NEGATIVE
Nitrite: NEGATIVE
Protein, ur: 30 mg/dL — AB
Specific Gravity, Urine: 1.013 (ref 1.005–1.030)
pH: 6 (ref 5.0–8.0)

## 2022-03-31 LAB — CBC WITH DIFFERENTIAL/PLATELET
Abs Immature Granulocytes: 0.02 10*3/uL (ref 0.00–0.07)
Basophils Absolute: 0 10*3/uL (ref 0.0–0.1)
Basophils Relative: 0 %
Eosinophils Absolute: 0.4 10*3/uL (ref 0.0–0.5)
Eosinophils Relative: 4 %
HCT: 37.9 % (ref 36.0–46.0)
Hemoglobin: 12.1 g/dL (ref 12.0–15.0)
Immature Granulocytes: 0 %
Lymphocytes Relative: 23 %
Lymphs Abs: 2.2 10*3/uL (ref 0.7–4.0)
MCH: 25.7 pg — ABNORMAL LOW (ref 26.0–34.0)
MCHC: 31.9 g/dL (ref 30.0–36.0)
MCV: 80.6 fL (ref 80.0–100.0)
Monocytes Absolute: 0.5 10*3/uL (ref 0.1–1.0)
Monocytes Relative: 6 %
Neutro Abs: 6.5 10*3/uL (ref 1.7–7.7)
Neutrophils Relative %: 67 %
Platelets: 310 10*3/uL (ref 150–400)
RBC: 4.7 MIL/uL (ref 3.87–5.11)
RDW: 15.3 % (ref 11.5–15.5)
WBC: 9.6 10*3/uL (ref 4.0–10.5)
nRBC: 0 % (ref 0.0–0.2)

## 2022-03-31 LAB — BASIC METABOLIC PANEL
Anion gap: 9 (ref 5–15)
BUN: 10 mg/dL (ref 6–20)
CO2: 20 mmol/L — ABNORMAL LOW (ref 22–32)
Calcium: 9.4 mg/dL (ref 8.9–10.3)
Chloride: 107 mmol/L (ref 98–111)
Creatinine, Ser: 0.95 mg/dL (ref 0.44–1.00)
GFR, Estimated: >60 mL/min (ref >60)
Glucose, Bld: 101 mg/dL — ABNORMAL HIGH (ref 70–99)
Potassium: 4 mmol/L (ref 3.5–5.1)
Sodium: 136 mmol/L (ref 135–145)

## 2022-03-31 LAB — ABO/RH: ABO/RH(D): B POS

## 2022-03-31 LAB — HCG, QUANTITATIVE, PREGNANCY: hCG, Beta Chain, Quant, S: 26494 m[IU]/mL — ABNORMAL HIGH (ref <5)

## 2022-03-31 LAB — HCG, SERUM, QUALITATIVE: Preg, Serum: POSITIVE — AB

## 2022-03-31 NOTE — ED Triage Notes (Signed)
Pt in reports vaginal bleeding while driving today at 0:06JG, pt reports saturating underwear at the time and now the bleeding has stopped, pt states, "I am [redacted] weeks pregnant and haven't went to the doctor yet. I have been going to a fertility clinic in [place], [place]." Pt reports intermittent lower abd cramping ?

## 2022-03-31 NOTE — ED Provider Notes (Addendum)
?[place] ?Provider Note ? ? ?CSN: [id number] ?Arrival date & time: 03/31/22  1116 ? ?  ? ?History ? ?Chief Complaint  ?Patient presents with  ? Vaginal Bleeding  ? ? ?Terri Cochran is a 31 y.o. female. ? ?HPI ? ?With medical history including anxiety, GERD, hypertension, PE, depression, headaches, present with chief complaint of vaginal bleeding.  Patient states that she was on her way to work.  Had a gush of blood come from her vagina. she states that she thought that she might have e urinated on himself but saw it was blood.  States that she was having some cramping around her pelvic region, she is currently having no vaginal bleeding or pelvic cramping at this time.  She states that she is [redacted] weeks pregnant, she had invitro fertilization up in [place], her last menstrual cycle was 03/29, states that this is her second pregnancy, her first pregnancy did not go to term,  miscarriages in the first trimester, no history of ovarian torsion cysts ectopic pregnancies, she has no stomach pain nausea vomiting diarrhea she has no other complaints at this time. ? ?Per up-to-date patient is 6 weeks 1 day pregnant, estimated due date would be November 23, 2022. ? ? ? ?Home Medications ?Prior to Admission medications   ?Medication Sig Start Date End Date Taking? Authorizing Provider  ?Cholecalciferol (D3-1000) 25 MCG (1000 UT) tablet Take 1 tablet by mouth daily. 04/14/21 04/14/22  [provider]  ?diphenhydramine-acetaminophen (TYLENOL PM) 25-500 MG TABS tablet Take 1 tablet by mouth at bedtime as needed.    [provider]  ?enoxaparin (LOVENOX) 100 MG/ML injection Inject 1 mL (100 mg total) into the skin every 12 (twelve) hours. 09/28/21   Harriett Rush, PA-C  ?linaclotide Rolan Lipa) 290 MCG CAPS capsule Take 1 capsule (290 mcg total) by mouth daily before breakfast. 05/01/20   Mansouraty, Telford Nab., MD  ?metFORMIN (GLUCOPHAGE-XR) 500 MG 24 hr tablet Take 500 mg by mouth daily.  06/15/21   [provider]  ?oxyCODONE-acetaminophen (PERCOCET/ROXICET) 5-325 MG tablet Take 1 tablet by mouth every 4 (four) hours as needed. 12/08/21   [provider]  ?progesterone 200 MG SUPP Place 200 mg vaginally at bedtime.    [provider]  ?   ? ?Allergies    ?Patient has no known allergies.   ? ?Review of Systems   ?Review of Systems  ?Constitutional:  Negative for chills and fever.  ?Respiratory:  Negative for shortness of breath.   ?Cardiovascular:  Negative for chest pain.  ?Gastrointestinal:  Negative for abdominal pain.  ?Genitourinary:  Positive for pelvic pain and vaginal bleeding. Negative for vaginal pain.  ?Neurological:  Negative for headaches.  ? ?Physical Exam ?Updated Vital Signs ?BP 101/65 (BP Location: Left Arm)   Pulse 96   Temp 98.9 ?F (37.2 ?C) (Oral)   Resp 18   Ht '5\' 6"'$  (1.676 m)   LMP 02/13/2022 (Exact Date)   SpO2 99%   Breastfeeding No   BMI 39.54 kg/m?  ?Physical Exam ?Vitals and nursing note reviewed.  ?Constitutional:   ?   General: She is not in acute distress. ?   Appearance: She is not ill-appearing.  ?HENT:  ?   Head: Normocephalic and atraumatic.  ?   Nose: No congestion.  ?Eyes:  ?   Conjunctiva/sclera: Conjunctivae normal.  ?Cardiovascular:  ?   Rate and Rhythm: Normal rate and regular rhythm.  ?   Pulses: Normal pulses.  ?   Heart  sounds: No murmur heard. ?  No friction rub. No gallop.  ?Pulmonary:  ?   Effort: No respiratory distress.  ?   Breath sounds: No wheezing, rhonchi or rales.  ?Abdominal:  ?   Palpations: Abdomen is soft.  ?   Tenderness: There is abdominal tenderness. There is no right CVA tenderness or left CVA tenderness.  ?   Comments: Abdomen nondistended, tender in the lower pelvic region mainly on the left adnexal versus the right, slight suprapubic tenderness, there is no guarding rebound tension peritoneal sign negative Murphy sign McBurney point no side tenderness or CVA tenderness.  ?Musculoskeletal:  ?   Right  lower leg: No edema.  ?   Left lower leg: No edema.  ?Skin: ?   General: Skin is warm and dry.  ?Neurological:  ?   Mental Status: She is alert.  ?Psychiatric:     ?   Mood and Affect: Mood normal.  ? ? ?ED Results / Procedures / Treatments   ?Labs ?(all labs ordered are listed, but only abnormal results are displayed) ?Labs Reviewed  ?BASIC METABOLIC PANEL - Abnormal; Notable for the following components:  ?    Result Value  ? CO2 20 (*)   ? Glucose, Bld 101 (*)   ? All other components within normal limits  ?CBC WITH DIFFERENTIAL/PLATELET - Abnormal; Notable for the following components:  ? MCH 25.7 (*)   ? All other components within normal limits  ?URINALYSIS, ROUTINE W REFLEX MICROSCOPIC - Abnormal; Notable for the following components:  ? APPearance HAZY (*)   ? Hgb urine dipstick LARGE (*)   ? Protein, ur 30 (*)   ? Bacteria, UA RARE (*)   ? All other components within normal limits  ?HCG, SERUM, QUALITATIVE - Abnormal; Notable for the following components:  ? Preg, Serum POSITIVE (*)   ? All other components within normal limits  ?HCG, QUANTITATIVE, PREGNANCY - Abnormal; Notable for the following components:  ? hCG, Beta [place], [place] 26,494 (*)   ? All other components within normal limits  ?ABO/RH  ? ? ?EKG ?None ? ?Radiology ?US OB LESS THAN 14 WEEKS WITH OB TRANSVAGINAL ? ?Result Date: 03/31/2022 ?CLINICAL DATA:  A 31 year old female presents with LEFT adnexal tenderness in the setting of positive pregnancy test also with suprapubic tenderness and heavy bleeding. Quantitative beta hCG of 26,494 with gestational age by last menstrual cycle of 6 weeks 4 days. EXAM: OBSTETRIC <14 WK [place] AND TRANSVAGINAL OB US TECHNIQUE: Both transabdominal and transvaginal ultrasound examinations were performed for complete evaluation of the gestation as well as the maternal uterus, adnexal regions, and pelvic cul-de-sac. Transvaginal technique was performed to assess early pregnancy. COMPARISON:  Previous sonogram from  January of 2023. FINDINGS: Intrauterine gestational sac: Visualized Yolk sac:  Visualized Embryo:  Visualized Cardiac Activity: Visualized Heart Rate: 109 bpm CRL:  4.2 mm mm   6 w   1 d                  [place] EDC: 11/23/2022 Subchorionic hemorrhage: Small subchorionic hemorrhage. Some blood in the endometrial canal adjacent to the gestational sac. Maternal uterus/adnexae: Focal area of contour bulging of the uterus anteriorly likely reflects uterine contraction. Possibility of submucosal leiomyoma is also considered or focal adenomyosis. No free fluid in the pelvis. Normal appearance of bilateral ovaries for age. IMPRESSION: Single viable intrauterine gestation with small amount of subchorionic hemorrhage and small amount of blood in the endometrial canal. Gestational age at 65  weeks 1 day by crown-rump length with heart rate of 109 beats per minute. Findings that favor myometrial contraction over focal uterine lesion such as leiomyoma or adenomyosis. No free fluid. Electronically Signed   By: Zetta Bills M.D.   On: 03/31/2022 16:19   ? ?Procedures ?Procedures  ? ? ?Medications Ordered in ED ?Medications - No data to display ? ?ED Course/ Medical Decision Making/ A&P ?  ?                        ?Medical Decision Making ?Amount and/or Complexity of Data Reviewed ?Labs: ordered. ?Radiology: ordered. ? ? ?This patient presents to the ED for concern of vaginal bleeding, this involves an extensive number of treatment options, and is a complaint that carries with it a high risk of complications and morbidity.  The differential diagnosis includes miscarriage, ectopic pregnancy, UTI ? ? ? ?Additional history obtained: ? ?Additional history obtained from N/A ?External records from outside source obtained and reviewed including previous lab work, imaging, patient history ? ? ?Co morbidities that complicate the patient evaluation ? ?Hypertension ? ?Social Determinants of Health: ? ?Does not have established care within the  area ? ? ? ?Lab Tests: ? ?I Ordered, and personally interpreted labs.  The pertinent results include: CBC unremarkable, BMP shows CO2 of 20, glucose 101, hCG positive, Rh B+,, hCG is 26,000. ? ? ?Imaging Studies ordered:

## 2022-03-31 NOTE — ED Notes (Signed)
[name], [place] RN at bedside for pelvic examination d/t vaginal bleeding, pt provided pads and mesh underwear for discharge and discharge plan discussed, pt and spouse verbalizes understanding ?

## 2022-03-31 NOTE — Discharge Instructions (Signed)
Lab work and imaging were reassuring, possibly bleeding was from a contraction, it is important that you follow-up with your fertility clinic for further evaluation. ? ?Come back to the emergency department if you develop chest pain, shortness of breath, severe abdominal pain, uncontrolled nausea, vomiting, diarrhea. ? ?

## 2022-04-06 ENCOUNTER — Inpatient Hospital Stay
Admit: 2022-04-06 | Discharge: 2022-04-06 | Disposition: A | Discharge status: Home / Self Care | Payer: TRICARE (CHAMPUS) | Attending: Emergency Medicine

## 2022-04-06 DIAGNOSIS — O034 Incomplete spontaneous abortion without complication: Secondary | ICD-10-CM

## 2022-04-06 MED ORDER — KETOROLAC TROMETHAMINE 10 MG PO TABS
10 MG | ORAL_TABLET | Freq: Four times a day (QID) | ORAL | 0 refills | Status: AC | PRN
Start: 2022-04-06 — End: ?

## 2022-04-06 MED ORDER — FENTANYL CITRATE (PF) 100 MCG/2ML IJ SOLN
100 MCG/2ML | INTRAMUSCULAR | Status: DC
Start: 2022-04-06 — End: 2022-04-06

## 2022-04-06 MED ORDER — FENTANYL CITRATE (PF) 100 MCG/2ML IJ SOLN
100 MCG/2ML | INTRAMUSCULAR | Status: AC
Start: 2022-04-06 — End: 2022-04-06
  Administered 2022-04-06: 22:00:00 50 ug via INTRAVENOUS

## 2022-04-06 MED ORDER — MORPHINE SULFATE (PF) 4 MG/ML IV SOLN
4 MG/ML | INTRAVENOUS | Status: AC
Start: 2022-04-06 — End: 2022-04-06
  Administered 2022-04-06: 21:00:00 4 mg via INTRAVENOUS

## 2022-04-06 MED ORDER — ONDANSETRON HCL 4 MG/2ML IJ SOLN
4 MG/2ML | Freq: Once | INTRAMUSCULAR | Status: AC
Start: 2022-04-06 — End: 2022-04-06
  Administered 2022-04-06: 21:00:00 4 mg via INTRAVENOUS

## 2022-04-06 MED ORDER — KETOROLAC TROMETHAMINE 15 MG/ML IJ SOLN
15 MG/ML | INTRAMUSCULAR | Status: AC
Start: 2022-04-06 — End: 2022-04-06
  Administered 2022-04-06: 22:00:00 15 mg via INTRAVENOUS

## 2022-04-06 MED FILL — MORPHINE SULFATE 4 MG/ML IV SOLN: 4 mg/mL | INTRAVENOUS | Qty: 1

## 2022-04-06 MED FILL — KETOROLAC TROMETHAMINE 15 MG/ML IJ SOLN: 15 MG/ML | INTRAMUSCULAR | Qty: 1

## 2022-04-06 MED FILL — ONDANSETRON HCL 4 MG/2ML IJ SOLN: 4 MG/2ML | INTRAMUSCULAR | Qty: 2

## 2022-04-06 MED FILL — FENTANYL CITRATE (PF) 100 MCG/2ML IJ SOLN: 100 MCG/2ML | INTRAMUSCULAR | Qty: 2

## 2022-04-06 NOTE — ED Triage Notes (Signed)
Per EMS, pt roughly [redacted] weeks pregnant and was told by OB/GYN that the fetus did not have a heartbeat so she was sent [facility] [facility]. Pt took medication and is now having abd cramping. Pt was told these are side affects of the medication however states the pain is too severe. Denies vaginal bleeding at this time.

## 2022-04-06 NOTE — ED Provider Notes (Signed)
[facility] EMERGENCY DEPT  EMERGENCY DEPARTMENT HISTORY AND PHYSICAL EXAM      Date: 04/06/2022  Patient Name: Priscilla Beck  MRN: [id number]  Birthdate Feb 17, 1991  Date of evaluation: 04/06/2022  Provider: Royanne Foots, MD   Note Started: 5:27 PM EDT 04/06/22    HISTORY OF PRESENT ILLNESS     Chief Complaint   Patient presents with    Miscarriage       History Provided By: Patient    HPI: Priscilla Beck is a 31 y.o. female with no significant past medical history presenting for miscarriage and abdominal pain.  Patient states that she is [redacted] weeks pregnant and recently had an ultrasound which showed no heartbeat to her pregnancy.  Was given misoprostol and started that at 6 AM this morning by [facility] clinic.  States that she has been in severe pain all day today.  Was taking hydrocodone but kept on vomiting it up because of the intense pain.  Has been unable to keep anything else down.  Has not had any vaginal bleeding or passing any clots yet.    PAST MEDICAL HISTORY   Past Medical History:  History reviewed. No pertinent past medical history.    Past Surgical History:  History reviewed. No pertinent surgical history.    Family History:  History reviewed. No pertinent family history.    Social History:  Social History     Tobacco Use    Smoking status: Never    Smokeless tobacco: Never       Allergies:  No Known Allergies    PCP: No primary care provider on file.    Current Meds:   Current Facility-Administered Medications   Medication Dose Route Frequency Provider Last Rate Last Admin    ketorolac (TORADOL) injection 15 mg  15 mg IntraVENous NOW Royanne Foots, MD        fentaNYL (SUBLIMAZE) injection 50 mcg  50 mcg IntraVENous NOW Royanne Foots, MD         Current Outpatient Medications   Medication Sig Dispense Refill    ketorolac (TORADOL) 10 MG tablet Take 1 tablet by mouth every 6 hours as needed for Pain 20 tablet 0       Social Determinants of Health:   Social Determinants of Health     Tobacco Use: Low Risk     Smoking  Tobacco Use: Never    Smokeless Tobacco Use: Never    Passive Exposure: Not on file   Alcohol Use: Not At Risk    Frequency of Alcohol Consumption: Never    Average Number of Drinks: Patient does not drink    Frequency of Binge Drinking: Never   Physicist, medical Strain: Not on file   Food Insecurity: Not on file   Transportation Needs: Not on file   Physical Activity: Not on file   Stress: Not on file   Social Connections: Not on file   Intimate Partner Violence: Not on file   Depression: Not on file   Housing Stability: Not on file       PHYSICAL EXAM   Physical Exam  Vitals and nursing note reviewed.   Constitutional:       Appearance: Normal appearance.   HENT:      Head: Normocephalic and atraumatic.      Nose: Nose normal.      Mouth/Throat:      Mouth: Mucous membranes are moist.   Eyes:      Extraocular Movements: Extraocular movements intact.  Conjunctiva/sclera: Conjunctivae normal.   Cardiovascular:      Rate and Rhythm: Normal rate and regular rhythm.   Pulmonary:      Effort: Pulmonary effort is normal. No respiratory distress.   Abdominal:      General: Abdomen is flat. There is no distension.      Tenderness: There is abdominal tenderness.      Comments: Suprapubic tenderness   Musculoskeletal:         General: Normal range of motion.      Cervical back: Normal range of motion.   Skin:     General: Skin is warm.   Neurological:      General: No focal deficit present.      Mental Status: She is alert. Mental status is at baseline.   Psychiatric:      Comments: Patient is laying on her side in somewhat fetal position, tearful when describing her pain         SCREENINGS               LAB, EKG AND DIAGNOSTIC RESULTS   Labs:  No results found for this or any previous visit (from the past 12 hour(s)).    EKG: Initial EKG interpreted by me.Not Applicable    Radiologic Studies:  Non-plain film images such as CT, Ultrasound and MRI are read by the radiologist. Plain radiographic images are visualized and  preliminarily interpreted by the ED Provider with the following findings: Not Applicable.    Interpretation per the Radiologist below, if available at the time of this note:  No orders to display        EMERGENCY DEPARTMENT COURSE and DIFFERENTIAL DIAGNOSIS/MDM   CC/HPI Summary, DDx, ED Course, and Reassessment: Patient presenting with lower abdominal pain with misoprostol on board for miscarriage.  Will get IV and give her Zofran as well as pain medication and speak with OB/GYN about neck steps.    Clinical Management Tools:  Not Applicable    Records Reviewed (source and summary of external notes): Prior medical records and Nursing notes    Vitals:    Vitals:    04/06/22 1613   BP: 129/74   Pulse: 84   Resp: 14   Temp: 98.4 F (36.9 C)   TempSrc: Oral   SpO2: 100%   Weight: 108.9 kg (240 lb)   Height: 1.676 m (5\' 6" )        ED COURSE  ED Course as of 04/06/22 1731   Wed Apr 06, 2022   1634 Spoke with OB/GYN on-call, Dr. Apr 08, 2022 who states that this is not technically an incomplete miscarriage as patient has not even started bleeding yet.  She would need to call her OB/GYN through [facility] clinic and they can electively plan a D&C if that is what they decide.  But because she is not bleeding yet, no indication to do a D&C at this point. [JS]      ED Course User Index  [JS] [facility], MD       Disposition Considerations (Tests not done, Shared Decision Making, Pt Expectation of Test or Treatment.): See ED Course    Patient was given the following medications:  Medications   ketorolac (TORADOL) injection 15 mg (has no administration in time range)   fentaNYL (SUBLIMAZE) injection 50 mcg (has no administration in time range)   ondansetron (ZOFRAN) injection 4 mg (4 mg IntraVENous Given 04/06/22 1635)   morphine (PF) injection 4 mg (4 mg IntraVENous Given 04/06/22  1635)       CONSULTS: (Who and What was discussed)  None     Social Determinants affecting Dx or Tx: None    Smoking Cessation: Not  Applicable    PROCEDURES   Unless otherwise noted above, none  Procedures      CRITICAL CARE TIME   Patient does not meet Critical Care Time, 0 minutes    ED FINAL IMPRESSION     1. Incomplete miscarriage    2. Inadequate pain control          DISPOSITION/PLAN   DISPOSITION Decision To Discharge 04/06/2022 05:29:38 PM    Discharge Note: The patient is stable for discharge home. The signs, symptoms, diagnosis, and discharge instructions have been discussed, understanding conveyed, and agreed upon. The patient is to follow up as recommended or return to ER should their symptoms worsen.      [facility]              DISCHARGE MEDICATIONS:     Medication List        START taking these medications      ketorolac 10 MG tablet  Commonly known as: TORADOL  Take 1 tablet by mouth every 6 hours as needed for Pain               Where to Get Your Medications        [facility] [id number] - [place], [place] - [address] - P [phone] Carmon Ginsberg- F [phone]  [address], [place] [id number]      Phone: [phone]   ketorolac 10 MG tablet           DISCONTINUED MEDICATIONS:  Current Discharge Medication List          I am the Primary Clinician of Record. Royanne FootsJoran Antoniette Peake, MD (electronically signed)    (Please note that parts of this dictation were completed with voice recognition software. Quite often unanticipated grammatical, syntax, homophones, and other interpretive errors are inadvertently transcribed by the computer software. Please disregards these errors. Please excuse any errors that have escaped final proofreading.)     Royanne FootsJoran Jassiah Viviano, MD  04/06/22 1731

## 2022-06-14 NOTE — Telephone Encounter (Signed)
Pt called regarding referral from [employer] for SWL program. Sent patient email with online seminar and advised of next steps with receiving an email and phone call from the practice.

## 2022-07-14 NOTE — Telephone Encounter (Signed)
Called patient to inform her we have received her completed New Bariatric paperwork. The patient has [employer] which requires an Insurance referral prior to scheduling with specialist provider. No answer, left voicemail advising patient to return call.

## 2022-07-21 ENCOUNTER — Ambulatory Visit: Admit: 2022-07-21 | Discharge: 2022-07-21 | Payer: TRICARE (CHAMPUS) | Attending: Surgery

## 2022-07-21 DIAGNOSIS — Z6835 Body mass index (BMI) 35.0-35.9, adult: Secondary | ICD-10-CM

## 2022-07-21 NOTE — Telephone Encounter (Signed)
Patient called to schedule appointment for psychological evaluation. Advised Georgeanna Harrison B is booked until January 2024. Provided patient with another resource.

## 2022-07-21 NOTE — Progress Notes (Signed)
Identified patient with two patient identifiers (name and DOB). Reviewed chart in preparation for visit and have obtained necessary documentation.    Priscilla Beck is a 31 y.o. female  Chief Complaint   Patient presents with    New Patient     Completed bariatric paperwork      BP 108/76 (Site: Right Upper Arm, Position: Sitting, Cuff Size: Medium Adult)   Pulse 84   Temp 98.6 F (37 C) (Oral)   Resp 16   Ht 5\' 6"  (1.676 m)   Wt 246 lb 9.6 oz (111.9 kg)   SpO2 97%   BMI 39.80 kg/m     1. Have you been to the ER, urgent care clinic since your last visit?  Hospitalized since your last visit?no    2. Have you seen or consulted any other health care providers outside of the [facility] since your last visit?  Include any pap smears or colon screening. No      Neck: 16in  Waist:47 in  Hips: 51 in

## 2022-07-21 NOTE — Progress Notes (Signed)
Bariatric Surgery Consult    Priscilla Beck is a 31 y.o. female with a history of morbid obesity. Her Height: 5\' 6"  (167.6 cm), Weight - Scale: 246 lb 9.6 oz (111.9 kg). Body mass index is 39.8 kg/m. She reports that she has been trying to lose weight for 5 years. Her maximum weight was 13 pounds. She has attended our bariatric surgery information seminar. Priscilla Beck wants to consider laparoscopic sleeve gastrectomy.     Pt is self-referred.    Dietary History:   The patient says that in the past, physician supervised, behavior modification, and unsupervised diets have not resulted in real success. When asked why she was not able to achieve or maintain significant weight loss she replied, " she has tried many times to lose weight on her own but has not been able to keep the weight off long-term".    Number of meals per day: 1  Portion size: Generally she eats 1 large meal a day.  She does snack periodically during the day.      Comorbidities:     Bariatric comorbidities present: non-insulin dependent diabetes and obstructive sleep apnea pulmonary embolism    Ambulatory status: independent    The patient's reported level of exercise: moderately active.      There are no problems to display for this patient.    No past medical history on file.   No past surgical history on file.   Social History     Tobacco Use    Smoking status: Never    Smokeless tobacco: Never   Substance Use Topics    Alcohol use: Not on file      No family history on file.   .  Current Outpatient Medications   Medication Sig    enoxaparin (LOVENOX) 300 MG/3ML injection Inject into the skin 2 times daily    metFORMIN (GLUCOPHAGE) 1000 MG tablet Take 1 tablet by mouth 2 times daily (with meals)    ketorolac (TORADOL) 10 MG tablet Take 1 tablet by mouth every 6 hours as needed for Pain (Patient not taking: Reported on 07/21/2022)     No current facility-administered medications for this visit.      Allergies   Allergen Reactions    Amoxicillin Rash          Review of Systems:    Constitutional: negative  Ears, Nose, Mouth, Throat, and Face: negative  Respiratory: negative  Cardiovascular: negative  Gastrointestinal: negative  Genitourinary:negative  Integument/Breast: negative  Hematologic/Lymphatic: negative  Musculoskeletal:negative  Neurological: negative  Behavioral/Psychiatric: negative  Endocrine: negative  Allergic/Immunologic: negative    Objective:     BP 108/76 (Site: Right Upper Arm, Position: Sitting, Cuff Size: Medium Adult)   Pulse 84   Temp 98.6 F (37 C) (Oral)   Resp 16   Ht 5\' 6"  (1.676 m)   Wt 246 lb 9.6 oz (111.9 kg)   SpO2 97%   BMI 39.80 kg/m      Physical Exam:    General:  alert, no distress, and morbidly obese   Eyes:  conjunctivae and sclerae normal, pupils equal, round, reactive to light, extraocular movements intact without nystagmus   Throat & Neck: no erythema or exudates noted or neck supple and symmetrical; no palpable masses   Lungs:   clear to auscultation bilaterally   Heart:  Regular rate and rhythm   Abdomen:   obese, soft, nontender, nondistended, no masses or organomegaly,    Extremities: no edema,  no gait disturbances  Skin: Normal.       Assessment:     1. Morbid obesity (Body mass index is 39.8 kg/m.) with multiple comorbidities.     The patient meets criteria established by the [employer] for weight loss surgery candidates. Without weight reduction, co-morbidities will escalate as well as increase risk of early mortality.  Our recommendation is the patient could be served with laparoscopic sleeve gastrectomy. I explained to the patient differences between laparoscopic gastric bypass, laparoscopic adjustable gastric banding, and laparoscopic vertical sleeve gastrectomy with respect to expected weight loss, resolution of comorbidities and risks. Priscilla Beck has attended one our informational meetings and has seen our educational materials. She has requested Dr. Nedra Hai to perform her procedure.     I reviewed the role for this  procedure as a tool to help her achieve her weight loss goals. I reminded her that effective weight loss comes from lifelong adherence to changes in dietary choices, eating habits and exercise.    Recommendation:     We will request approval for laparoscopic sleeve gastrectomy. We recommend that the patient undergo the following evaluations prior to considering surgery:    Cardiology: no  Dietician: yes  Gastroenterology: yes  Psychiatry/Psychology: yes  Pulmonology: no  Sleep Medicine: no    She appears to be a good candidate for sleeve gastrectomy.  Her primary motivation is losing weight to get pregnant.  She has PCOS and wants to have this faster resolution of this issue as possible.  She does have a history of pulmonary embolism and she says she is on Lovenox currently.  She will need clearance from her hematologist for surgery and also be able to come off Lovenox.  She will also need to have an upper GI swallow study and EGD to evaluate her for any GERD related issues and being appropriate for sleeve.  Her GERD and issues are generally minimal and only related to acidic foods.  Her weight is relatively low and we will just need to keep her weight neutral.    Signed By: Marton Redwood, MD     July 21, 2022       Greater than half of the time: 60 minutes was used in counciling the patient about bariatric surgery and the steps she needs to take to move forward with her surgery.      Priscilla Beck has a reminder for a "due or due soon" health maintenance. I have asked that she contact her primary care provider for follow-up on this health maintenance.

## 2022-07-21 NOTE — Telephone Encounter (Signed)
Patient came into office regarding medical weight loss. Sent pre- recorded orientation via e-mail.

## 2022-07-22 ENCOUNTER — Encounter: Payer: TRICARE (CHAMPUS) | Attending: Registered"

## 2022-07-22 ENCOUNTER — Ambulatory Visit: Admit: 2022-07-22 | Discharge: 2022-07-22 | Attending: Registered"

## 2022-07-22 NOTE — Progress Notes (Signed)
Pre-operative Bariatric Nutrition Evaluation    Date: 07/22/2022              Session 1 of 4    Insurance: [employer]            Physician/Surgeon: Dr. Marton Redwood      Name: Priscilla Beck  DOB:  09-03-1991  Age:  31  Gender: Female  Type of Surgery: []    Gastric Bypass   [x]     Sleeve Gastrectomy    ASSESSMENT:    Past Medical History: PCOS, Pre-DM     Medications/Supplements:   Prior to Admission medications    Medication Sig Start Date End Date Taking? Authorizing Provider   enoxaparin (LOVENOX) 300 MG/3ML injection Inject into the skin 2 times daily    Historical Provider, MD   metFORMIN (GLUCOPHAGE) 1000 MG tablet Take 1 tablet by mouth 2 times daily (with meals)    Historical Provider, MD   ketorolac (TORADOL) 10 MG tablet Take 1 tablet by mouth every 6 hours as needed for Pain  Patient not taking: Reported on 07/21/2022 04/06/22   07/23/2022, MD       Smoking: None  Alcohol: None    Food Allergies/Intolerances: lactose intolerant     Anthropometrics:    Ht: 66 inches   Wt: 246 lbs    IBW: 130 lbs    %IBW: 189     BMI: 39.8   Category: Obesity class III    Reported wt history: Pt presents today for pre-op nutrition evaluation for wt loss surgery. Reports lowest adult BW of 105 lbs and highest adult BW of 250 lbs. Attributes wt gain over the years r/t stress, PCOS . Has attempted wt loss through various methods with most successful wt loss of 12 lbs. Has been unable to maintain long term or significant wt loss and is now seeking approval for weight loss surgery. Pt will need to complete 4 months of supervised weight loss for insurance requirements.     Exercise/Physical Activity: walking dog for 10 min, 2x day    Reported Diet History: smaller portions, exercise, protein shakes, phentermine, Slimfast, Atkins, intermittent fasting    24 Hour Diet Recall  Breakfast  Skips or fruit or fast food   Snacks     Lunch 3pm Take out- 04/08/22 food- chix, rice   Snacks     Dinner  Skips or steak or ribs or chix wings or out to  eat   Snacks  Snickers or Slim jim occasionally   Beverages  Water, Chickfila lemonade occasionally; "forgets to drink"   Has to remind self to eat- doesn't feel hungry. Out to eat/take out daily. Emotional eating noted- stress eating at times    Environment/Psychosocial/Support: Pt states sister and husband supportive of surgery. Sister has had bariatric surgery.       NUTRITION DIAGNOSIS:  Food and nutrition related knowledge deficit r/t lack of prior exposure to information evidenced by pt demonstrates need for nutrition education for sleeve gastrectomy      NUTRITION INTERVENTION:  Pt educated on nutrition recommendations for weight loss surgery, specifically sleeve gastrectomy.    Instructed on consuming 3 meals per day starting now.  Use the balanced plate method to plan meals, include 3 oz of lean source of protein, 1/2 cup whole grains, unlimited non-starchy vegetables, 1/2 cup fruit and 1 serving of low fat dairy. Utilize handouts listing healthy snack and meal ideas to limit restaurant meals.      After surgery measure  all meals to 1/2 cup. Each meal will contain a 1/4 cup lean protein and 1/4 cup fruit, non-starchy vegetable or starch (limiting to once per day). Aim for 60 g protein per day. Sip on 48-64 oz of sugar free, calorie free, non-carbonated beverages each day. Do not use a straw. Do not consume beverages 30 minutes before, during or 30 minutes after meals.     Read all nutrition labels. Demonstrated and emphasized identifying serving size, total fat, sugar and protein content. Defined low fat as </= 3 g per serving. Discussed lean and extra lean sources of protein. Provided list of low fat cooking methods. Avoid foods with sugar listed in the first 3 ingredients and >/15 g sugar per serving. Excess sugar/fat intake may lead to dumping syndrome. Discussed signs and symptoms of dumping syndrome.     Practice mindful eating habits; take small bites, chew thoroughly, avoid distractions, utilize  hunger/fullness scale. Consume meals over 20-30 minutes.     Attend Bariatric Support Group and increase physical activity (approved per MD) for long term weight maintenance.      NUTRITION MONITORING AND EVALUATION:    The following goals were established with patient;  Use protein shake for a meal  Use balanced plate method at meals  3.  Review nutrition education materials. Follow up next month for continue nutrition education      Specific tips and techniques to facilitate compliance with above recommendations were provided and discussed. Nutrition evaluation reveals important lifestyle changes are indicated. Goals set and recommendations made. Will continue to assess.  If further details are desired please feel free to contact me at [phone].  This phone number was also provided to the patient for any further questions or concerns.           Carmela Hurt, RD

## 2022-07-29 NOTE — Progress Notes (Signed)
Referral was faxed to [facility] with confirmation.

## 2022-08-22 ENCOUNTER — Encounter: Admit: 2022-08-22 | Discharge: 2022-08-22 | Payer: TRICARE (CHAMPUS) | Attending: Registered"

## 2022-08-22 NOTE — Progress Notes (Signed)
Tree surgeon at [facility]  Supervised Weight Loss     Date:   08/22/2022    Patient's Name: Priscilla Beck  DOB: 1991/06/02    Insurance:  [employer]          Session: 2 of  4  Surgery: Sleeve gastrectomy  Surgeon:  Dr. Marton Redwood    Height: 66 inches Weight:   243       Lbs.   BMI: 39   Pounds Lost since last month: 3 lbs               Pounds Gained since last month: 0    Starting Weight: 246 lbs   Previous Month's Weight: 246 lbs  Overall Pounds Lost: 3 lbs Overall Pounds Gained: 0    Other Pertinent Information: Today's appointment was completed in a virtual setting.        Smoking Status:  None  Alcohol Intake: None    I have reviewed with pt the guidelines of the supervised wt loss program.  Pt understands the expectations of some wt loss during the program and that wt gain could delay the process. I have also explained that appointments need to be consecutive and missing an appointment may result in starting over. Pt has received this information in writing.          Changes that patient has made since last month include:  started [facility]- states just diagnosed with DM, having Premier protein for bfast some days.      Eating Habits and Behaviors  General healthy eating guidelines were also discussed. Pts were instructed that their plate should be made up 1/2 plate coming from non-starchy vegetables, 1/4 coming from lean meat, and 1/4 of their plate coming from carbohydrates, including fruits, starches, or milk. We discussed measuring meals to 1/2 cup total per meal after surgery. Drinking only calorie-free, sugar-free and non-carbonated beverages. We discussed the importance of drinking 64 ounces of fluid per day to prevent dehydration post-operatively.                       Patient's current diet habits include: Pt is eating 2-3 meals per day (B: 2 eggs or Premier protein; L: skipped over past weekend-stressed or Chickfila Cobb salad; D: salmon, asparagus). Snack choices include yogurt  covered raisins. Pt is eating refined carbohydrate foods (bread, pasta, rice, potatoes) occasionally. Pt is eating sweets/desserts occasionally. Pt is using healthy cooking methods. Pt is eating meals prepared outside of the home occasionally. Pt is drinking water. Pt reports emotional eating- stress (either eats too much or nothing).         Physical Activity/Exercise  An exercise presentation was provided including information about exercise programs available both before and after surgery. We talked about the importance of increasing daily physical activity and beginning to develop an exercise regimen/routine. We talked about exercise as being an important part of long term weight loss after surgery.     Comments:  During class, I discussed with patient the importance of getting into an exercise routine.  Pt is currently walking a little for activity; recently had surgery.  Pt has been encouraged to start a consistent exercise routine when able.    Behavior Modification       We talked about how to eat more mindfully. Tips and recommendations for how to make these changes were provided. Pt was encouraged to keep a food journal and record what they were taking in  daily.     Reviewed food labels and protein. Discuss portion sizes, drinking rule next month.    Overall Assessment: Nutrition evaluation reveals important lifestyle changes are indicated. Goals set and recommendations made. Will continue to assess.      Patient-Set Goals:   1. Nutrition - have consistent meals-no skipping  2. Exercise - start consistent exercise program when able  3. Behavior - read food labels for total fat and added sugars    Vick Frees, RD  08/22/2022

## 2022-09-26 ENCOUNTER — Encounter: Payer: TRICARE (CHAMPUS) | Attending: Registered"

## 2022-10-02 IMAGING — US US OB < 14 WEEKS - US OB TV
1 series · 13 of 28 positions shown · non-contrast
Comparison: Previous sonogram from Sunday November, 2021.

CLINICAL DATA: A 31-year-old female presents with LEFT adnexal
tenderness in the setting of positive pregnancy test also with
suprapubic tenderness and heavy bleeding. Quantitative beta hCG of
26,494 with gestational age by last menstrual cycle of 6 weeks 4
days.

EXAM:
OBSTETRIC <14 WK US AND TRANSVAGINAL OB US
TECHNIQUE: Both transabdominal and transvaginal ultrasound examinations were
performed for complete evaluation of the gestation as well as the
maternal uterus, adnexal regions, and pelvic cul-de-sac.
Transvaginal technique was performed to assess early pregnancy.

[Series 1: us ob comp less 14 wks · 150 acquisitions, 13 frames shown]
[im 6/150]
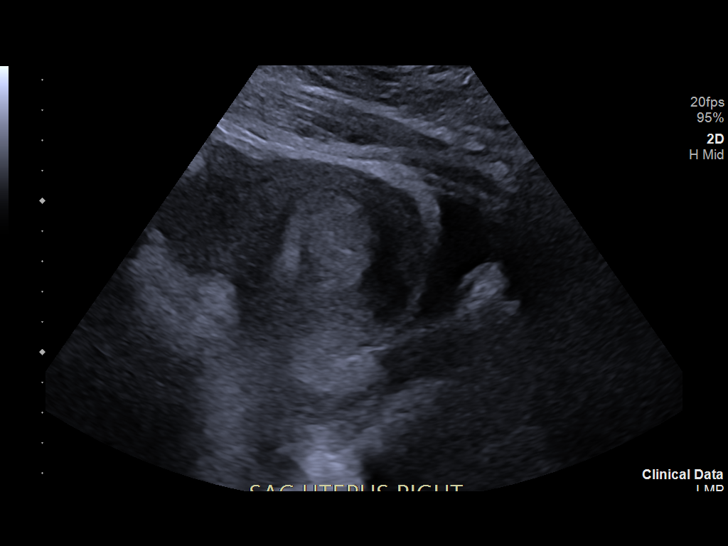
[im 17/150]
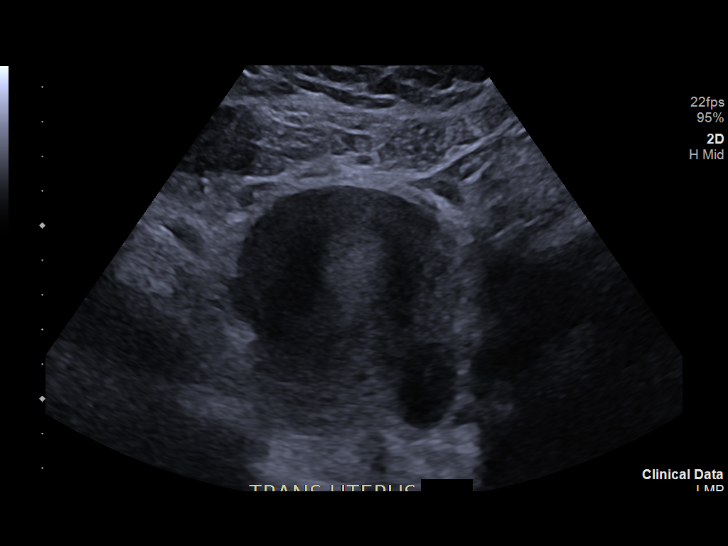
[im 28/150]
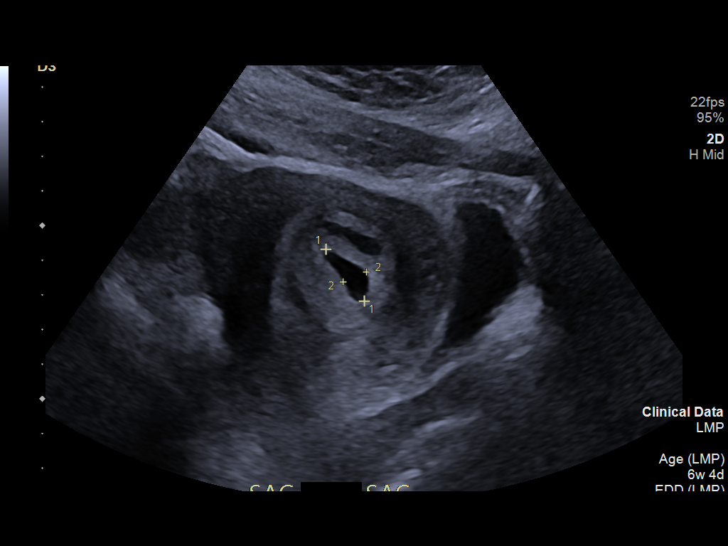
[im 39/150]
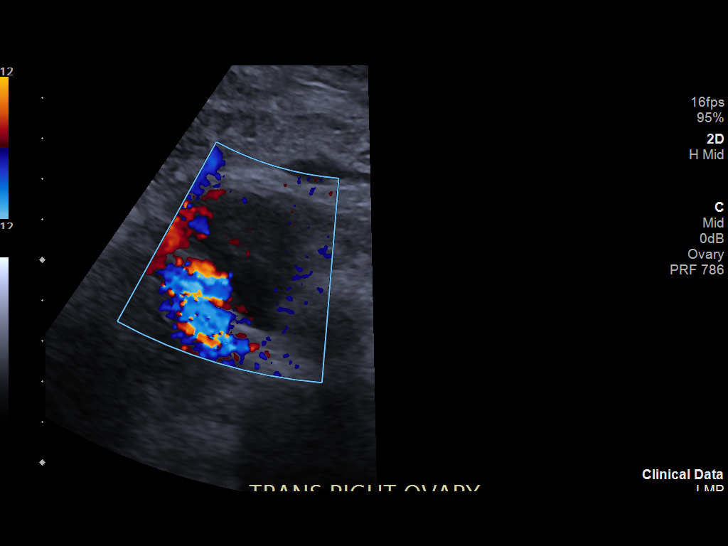
[im 50/150]
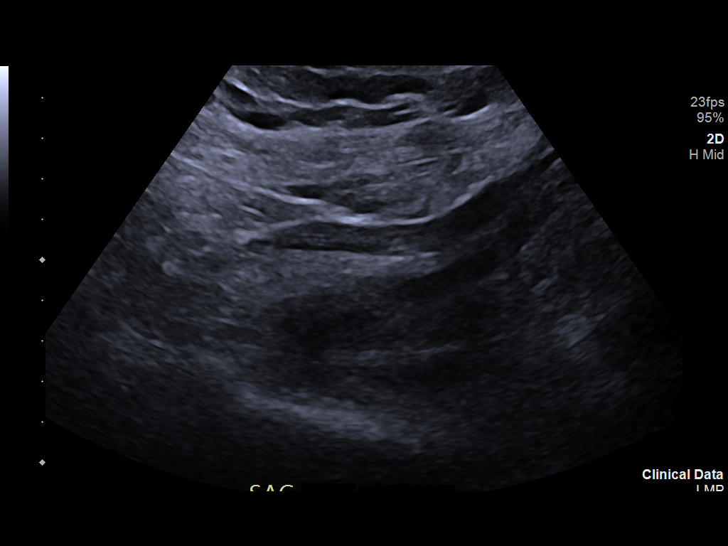
[im 61/150]
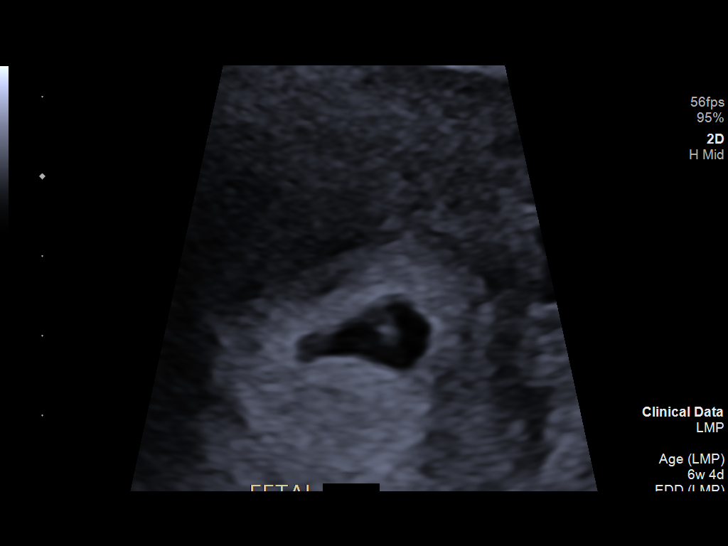
[im 78/150]
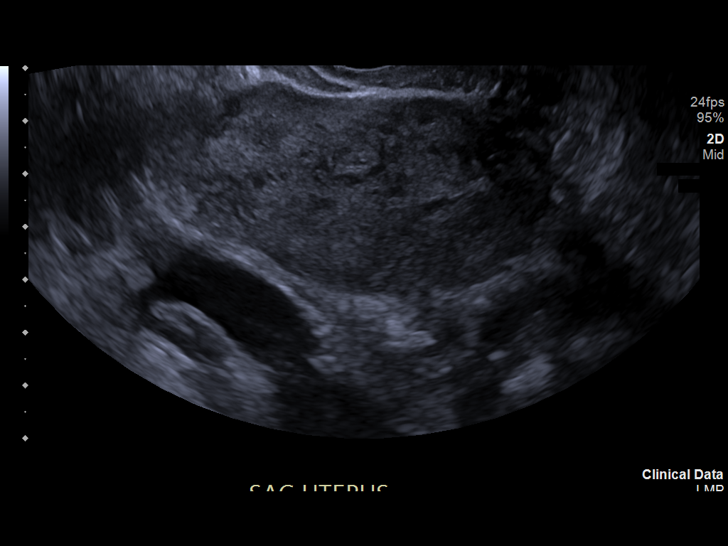
[im 89/150]
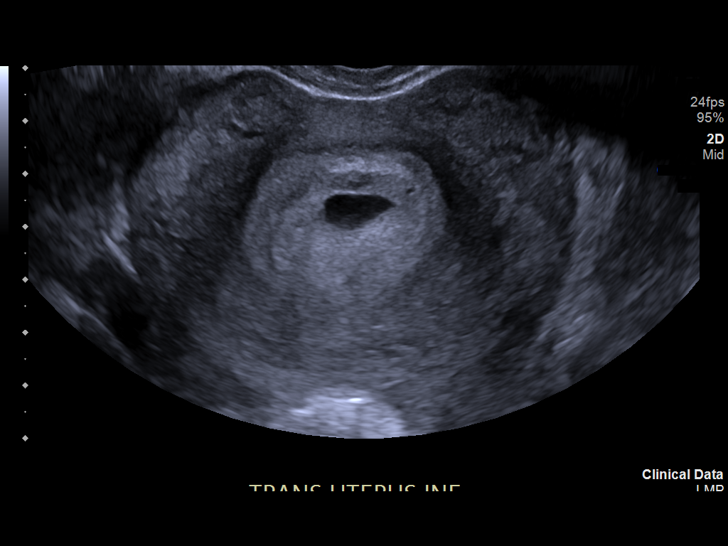
[im 100/150]
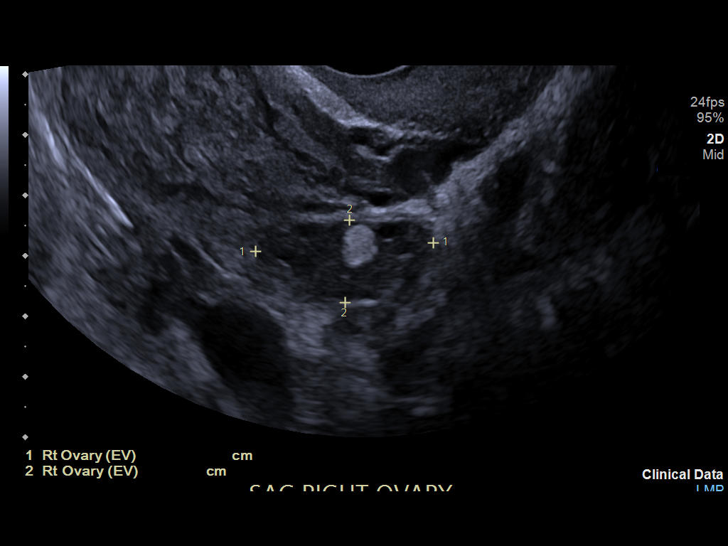
[im 111/150]
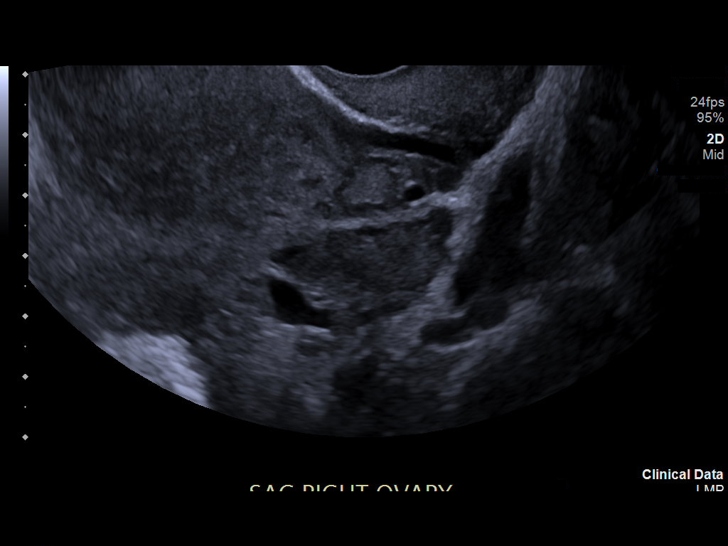
[im 122/150]
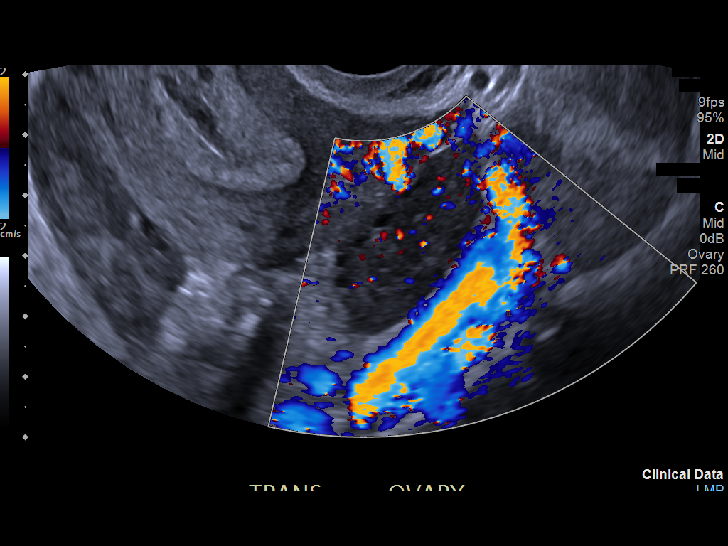
[im 133/150]
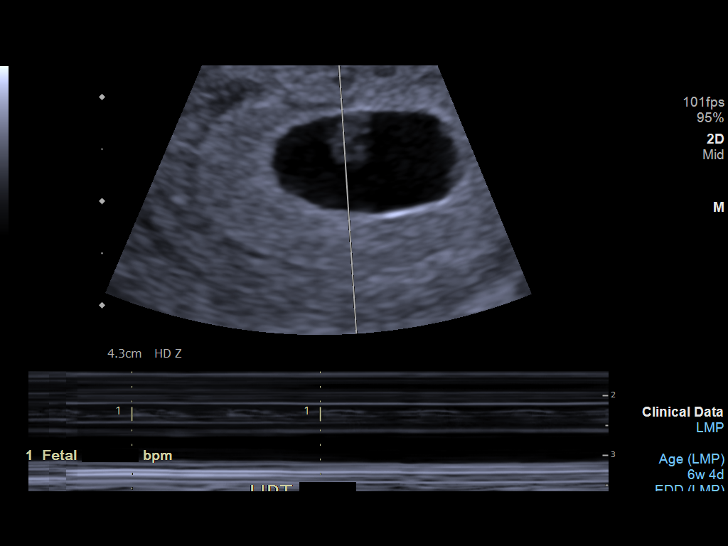
[im 144/150]
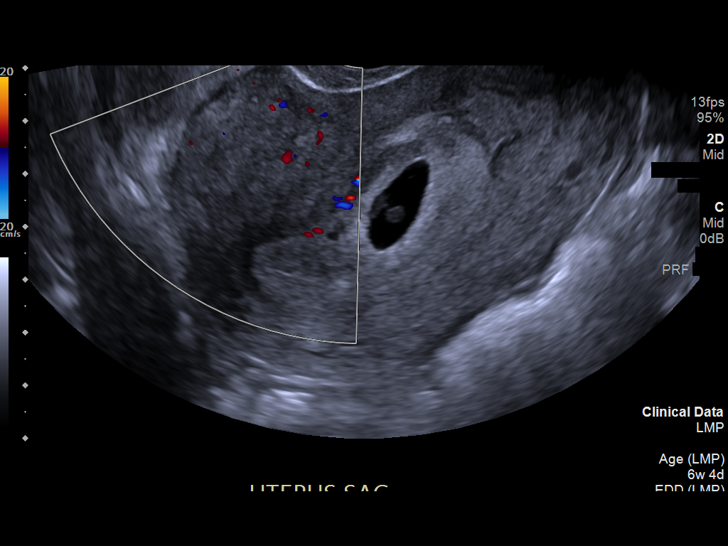

[13 of 28 positions shown; findings below may reference images not displayed]

FINDINGS: Intrauterine gestational sac: Visualized

Yolk sac:  Visualized

Embryo:  Visualized

Cardiac Activity: Visualized

Heart Rate: 109 bpm

CRL:  4.2 mm mm   6 w   1 d                  US EDC: 11/23/2022

Subchorionic hemorrhage: Small subchorionic hemorrhage. Some blood
in the endometrial canal adjacent to the gestational sac.

Maternal uterus/adnexae:

Focal area of contour bulging of the uterus anteriorly likely
reflects uterine contraction. Possibility of submucosal leiomyoma is
also considered or focal adenomyosis.

No free fluid in the pelvis.

Normal appearance of bilateral ovaries for age.
IMPRESSION: Single viable intrauterine gestation with small amount of
subchorionic hemorrhage and small amount of blood in the endometrial
canal. Gestational age at 6 weeks 1 day by crown-rump length with
heart rate of 109 beats per minute.

Findings that favor myometrial contraction over focal uterine lesion
such as leiomyoma or adenomyosis.

No free fluid.

## 2022-10-05 ENCOUNTER — Encounter: Payer: TRICARE (CHAMPUS) | Attending: Registered"

## 2023-01-17 ENCOUNTER — Other Ambulatory Visit: Payer: Self-pay

## 2023-01-17 ENCOUNTER — Encounter (HOSPITAL_COMMUNITY): Payer: Self-pay

## 2023-01-17 ENCOUNTER — Emergency Department (HOSPITAL_COMMUNITY)

## 2023-01-17 ENCOUNTER — Emergency Department (HOSPITAL_COMMUNITY)
Admission: EM | Admit: 2023-01-17 | Discharge: 2023-01-18 | Disposition: A | Discharge status: Home / Self Care | Attending: Emergency Medicine | Admitting: Emergency Medicine

## 2023-01-17 DIAGNOSIS — N750 Cyst of Bartholin's gland: Secondary | ICD-10-CM | POA: Diagnosis not present

## 2023-01-17 DIAGNOSIS — E871 Hypo-osmolality and hyponatremia: Secondary | ICD-10-CM | POA: Insufficient documentation

## 2023-01-17 DIAGNOSIS — A599 Trichomoniasis, unspecified: Secondary | ICD-10-CM

## 2023-01-17 DIAGNOSIS — O26891 Other specified pregnancy related conditions, first trimester: Secondary | ICD-10-CM

## 2023-01-17 DIAGNOSIS — R1011 Right upper quadrant pain: Secondary | ICD-10-CM | POA: Insufficient documentation

## 2023-01-17 DIAGNOSIS — I10 Essential (primary) hypertension: Secondary | ICD-10-CM | POA: Diagnosis not present

## 2023-01-17 DIAGNOSIS — O26899 Other specified pregnancy related conditions, unspecified trimester: Secondary | ICD-10-CM | POA: Insufficient documentation

## 2023-01-17 DIAGNOSIS — Z79899 Other long term (current) drug therapy: Secondary | ICD-10-CM | POA: Insufficient documentation

## 2023-01-17 LAB — URINALYSIS, ROUTINE W REFLEX MICROSCOPIC
Bilirubin Urine: NEGATIVE
Glucose, UA: NEGATIVE mg/dL
Hgb urine dipstick: NEGATIVE
Ketones, ur: NEGATIVE mg/dL
Nitrite: NEGATIVE
Protein, ur: NEGATIVE mg/dL
Specific Gravity, Urine: 1.018 (ref 1.005–1.030)
pH: 7 (ref 5.0–8.0)

## 2023-01-17 LAB — COMPREHENSIVE METABOLIC PANEL
ALT: 21 U/L (ref 0–44)
AST: 18 U/L (ref 15–41)
Albumin: 3.6 g/dL (ref 3.5–5.0)
Alkaline Phosphatase: 43 U/L (ref 38–126)
Anion gap: 11 (ref 5–15)
BUN: 13 mg/dL (ref 6–20)
CO2: 21 mmol/L — ABNORMAL LOW (ref 22–32)
Calcium: 9.1 mg/dL (ref 8.9–10.3)
Chloride: 100 mmol/L (ref 98–111)
Creatinine, Ser: 0.98 mg/dL (ref 0.44–1.00)
GFR, Estimated: >60 mL/min (ref >60)
Glucose, Bld: 115 mg/dL — ABNORMAL HIGH (ref 70–99)
Potassium: 3.7 mmol/L (ref 3.5–5.1)
Sodium: 132 mmol/L — ABNORMAL LOW (ref 135–145)
Total Bilirubin: 0.5 mg/dL (ref 0.3–1.2)
Total Protein: 7.3 g/dL (ref 6.5–8.1)

## 2023-01-17 LAB — CBC
HCT: 37 % (ref 36.0–46.0)
Hemoglobin: 12 g/dL (ref 12.0–15.0)
MCH: 26.1 pg (ref 26.0–34.0)
MCHC: 32.4 g/dL (ref 30.0–36.0)
MCV: 80.6 fL (ref 80.0–100.0)
Platelets: 276 10*3/uL (ref 150–400)
RBC: 4.59 MIL/uL (ref 3.87–5.11)
RDW: 18.4 % — ABNORMAL HIGH (ref 11.5–15.5)
WBC: 10.3 10*3/uL (ref 4.0–10.5)
nRBC: 0 % (ref 0.0–0.2)

## 2023-01-17 LAB — WET PREP, GENITAL
Clue Cells Wet Prep HPF POC: NONE SEEN
Sperm: NONE SEEN
WBC, Wet Prep HPF POC: <10 (ref <10)
Yeast Wet Prep HPF POC: NONE SEEN

## 2023-01-17 LAB — LIPASE, BLOOD: Lipase: 30 U/L (ref 11–51)

## 2023-01-17 LAB — HCG, QUANTITATIVE, PREGNANCY: hCG, Beta Chain, Quant, S: 101562 m[IU]/mL — ABNORMAL HIGH (ref <5)

## 2023-01-17 MED ORDER — LIDOCAINE HCL (PF) 1 % IJ SOLN
10.0000 mL | Freq: Once | INTRAMUSCULAR | Status: AC
  Administered 2023-01-18: 10 mL
  Filled 2023-01-17: qty 10

## 2023-01-17 NOTE — ED Triage Notes (Addendum)
Patient recently had abscess on left breast right before finding out she was pregnant.  Patient is [redacted] weeks pregnant and left breast is discolored but has a mammogram scheduled.  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  Patient also reports the left side of her vagina is swollen and painful. Patient reports her husband was recently dx with trich.

## 2023-01-17 NOTE — ED Notes (Signed)
Patient transported to Ultrasound 

## 2023-01-17 NOTE — ED Notes (Signed)
Lab called and stated they d/c'd urine pregnancy because hcg was about to result.

## 2023-01-18 MED ORDER — METRONIDAZOLE 500 MG PO TABS: 500.0000 mg | ORAL_TABLET | Freq: Two times a day (BID) | ORAL | 0 refills | Status: AC

## 2023-01-18 MED ORDER — CEPHALEXIN 500 MG PO CAPS: 500.0000 mg | ORAL_CAPSULE | Freq: Four times a day (QID) | ORAL | 0 refills | Status: AC

## 2023-01-18 NOTE — ED Provider Notes (Signed)
[facility] Provider Note   CSN: [id number] Arrival date & time: 01/17/23  1700     History Chief Complaint  Patient presents with   Abdominal Pain    Terri Cochran is a 32 y.o. female with h/o PE, anxiety, HTN, benign teratoma, HS, currently 7w gestation presents to the ER for evaluation of multiple complaints. The patient reports that she is concerned that she might have another teratoma as when she stands she notices a mass in her LUQ. This has been present for the past 1-2 weeks. She has seen her PCP about this yesterday. It is non tender. She has some nausea, but associated this with her pregnancy, but no vomiting or changes to her BM. Additionally, she reports that her husband was diagnosed with trich and she would like to be treated. Additionally, she has been using progesterone suppositories during this pregnancy and is having some irritation and pain to her left labia for the past few days. She saw her PCP about this yesterday. She denies any fevers, chills, chest pain, SOB, vomiting, hematuria, dysuria, melena, hematochezia, vaginal bleeding. Chart mentions allergy to amoxicillin, however the patient denies this.    Abdominal Pain Associated symptoms: nausea   Associated symptoms: no chest pain, no chills, no constipation, no cough, no diarrhea, no dysuria, no fever, no hematuria, no shortness of breath, no vaginal bleeding and no vomiting        Home Medications Prior to Admission medications   Medication Sig Start Date End Date Taking? Authorizing Provider  cephALEXin (KEFLEX) 500 MG capsule Take 1 capsule (500 mg total) by mouth 4 (four) times daily. 01/18/23  Yes Sherrell Puller, PA-C  enoxaparin (LOVENOX) 40 MG/0.4ML injection Inject 40 mg into the skin daily. 12/30/22  Yes [provider]  metroNIDAZOLE (FLAGYL) 500 MG tablet Take 1 tablet (500 mg total) by mouth 2 (two) times daily. 01/18/23  Yes Sherrell Puller, PA-C   progesterone 200 MG SUPP Place 200 mg vaginally 2 (two) times daily.   Yes [provider]  buPROPion (WELLBUTRIN XL) 150 MG 24 hr tablet  12/20/22   [provider]  busPIRone (BUSPAR) 15 MG tablet  12/20/22   [provider]      Allergies    Amoxicillin    Review of Systems   Review of Systems  Constitutional:  Negative for chills and fever.  Respiratory:  Negative for cough and shortness of breath.   Cardiovascular:  Negative for chest pain.  Gastrointestinal:  Positive for nausea. Negative for abdominal pain, blood in stool, constipation, diarrhea and vomiting.       Reports abdominal mass  Genitourinary:  Positive for vaginal pain. Negative for dysuria, hematuria and vaginal bleeding.    Physical Exam Updated Vital Signs BP 108/72   Pulse 82   Temp 98.5 F (36.9 C) (Oral)   Resp 16   Ht '5\' 6"'$  (1.676 m)   Wt 111.1 kg   SpO2 99%   BMI 39.54 kg/m  Physical Exam Vitals and nursing note reviewed.  Constitutional:      General: She is not in acute distress.    Appearance: She is not ill-appearing or toxic-appearing.  HENT:     Mouth/Throat:     Mouth: Mucous membranes are moist.  Cardiovascular:     Rate and Rhythm: Normal rate.  Pulmonary:     Effort: Pulmonary effort is normal. No respiratory distress.  Abdominal:     General: Bowel sounds are  normal. There is no distension.     Palpations: Abdomen is soft.     Tenderness: There is abdominal tenderness in the suprapubic area.     Comments: Mild suprapubic tenderness to palpation. Soft. No guarding or rebound. No mass palpated or seen when lying flat. LUQ non tender. When she is standing there is a hand print size mass in the LUQ that is firm to palpation. NO overlying skin changes noted other than tattoos and surgical scars present.   Genitourinary:    Comments: Patient declined speculum exam. She does have very thick white/yellow discharge, but she reports that is because she just placed  her suppository. On the inside of the left labia minora there is a firm mass with fluctuance noted and the more inferior aspect of the labia. Approximately the size of a small quail egg. Consistent with Bartholin's.  Neurological:     Mental Status: She is alert.     ED Results / Procedures / Treatments   Labs (all labs ordered are listed, but only abnormal results are displayed) Labs Reviewed  WET PREP, GENITAL - Abnormal; Notable for the following components:      Result Value   Trich, Wet Prep PRESENT (*)    All other components within normal limits  COMPREHENSIVE METABOLIC PANEL - Abnormal; Notable for the following components:   Sodium 132 (*)    CO2 21 (*)    Glucose, Bld 115 (*)    All other components within normal limits  CBC - Abnormal; Notable for the following components:   RDW 18.4 (*)    All other components within normal limits  URINALYSIS, ROUTINE W REFLEX MICROSCOPIC - Abnormal; Notable for the following components:   APPearance CLOUDY (*)    Leukocytes,Ua TRACE (*)    Bacteria, UA RARE (*)    All other components within normal limits  HCG, QUANTITATIVE, PREGNANCY - Abnormal; Notable for the following components:   hCG, Beta Chain, Quant, S 101,562 (*)    All other components within normal limits  URINE CULTURE  LIPASE, BLOOD  GC/CHLAMYDIA PROBE AMP ([place]) NOT AT [facility]    EKG None  Radiology US OB Comp Less 14 Wks  Result Date: 01/17/2023 CLINICAL DATA:  Suprapubic pain, beta HCG 101,562 EXAM: OBSTETRIC <14 WK ULTRASOUND TECHNIQUE: Transabdominal ultrasound was performed for evaluation of the gestation as well as the maternal uterus and adnexal regions. COMPARISON:  None Available. FINDINGS: Intrauterine gestational sac: Single Yolk sac:  Visualized. Embryo:  Visualized. Cardiac Activity: Visualized. Heart Rate: 151 bpm CRL:   11.7 mm   7 w 2 d                  [place] EDC: 09/03/2023 Subchorionic hemorrhage: Small subchorionic hemorrhage along the superior  margin of the gestational sac. Maternal uterus/adnexae: Bilateral ovaries are unremarkable. No adnexal masses or free fluid. IMPRESSION: 1. Single live intrauterine pregnancy as above, estimated age 27 weeks and 2 days. 2. Small subchorionic hemorrhage. 3. Otherwise unremarkable exam. Electronically Signed   By: Randa Ngo M.D.   On: 01/17/2023 21:10    Procedures .Marland KitchenIncision and Drainage  Date/Time: 01/18/2023 2:40 PM  Performed by: Sherrell Puller, PA-C Authorized by: Sherrell Puller, PA-C   Consent:    Consent obtained:  Verbal   Consent given by:  Patient   Risks, benefits, and alternatives were discussed: yes     Risks discussed:  Bleeding, incomplete drainage, pain and infection   Alternatives discussed:  No treatment and  delayed treatment Universal protocol:    Procedure explained and questions answered to patient or proxy's satisfaction: yes     Patient identity confirmed:  Verbally with patient Location:    Type:  Bartholin cyst   Size:  3cm   Location:  Anogenital   Anogenital location:  Bartholin's gland Pre-procedure details:    Skin preparation:  Povidone-iodine Sedation:    Sedation type:  None Anesthesia:    Anesthesia method:  Local infiltration   Local anesthetic:  Lidocaine 1% w/o epi Procedure type:    Complexity:  Simple Procedure details:    Incision types:  Single straight   Incision depth:  Subcutaneous   Drainage:  Purulent and bloody   Drainage amount:  Moderate   Wound treatment:  Wound left open   Packing materials:  None Post-procedure details:    Procedure completion:  Procedure terminated at patient's request Comments:     Area was well cleansed with iodine multiple times. Lidocaine 1% without epi was injected to the surrounding abscess as well as on top. Anesthesia was achieved. Single straight incision made and moderate amount of purulence followed by bloody purulence was expressed. The patient did not tolerate the pressure with the expression of  the purulence. She asked to stop the procedure. I was unable to further deloculate and irrigate the wound, however I was not able to express any more purulence and the wounds appeared rather flat.     Medications Ordered in ED Medications  lidocaine (PF) (XYLOCAINE) 1 % injection 10 mL (10 mLs Other Given 01/18/23 0046)    ED Course/ Medical Decision Making/ A&P                           Medical Decision Making Amount and/or Complexity of Data Reviewed Labs: ordered. Radiology: ordered.  Risk Prescription drug management.   32 y.o. female presents to the ER for evaluation of multiple complaints including vaginal irritation, LUQ mass, and STD exposure. Differential diagnosis includes but is not limited to vaginitis, vaginosis, PID, STD, Bartholin's, dermatitis, teratoma, splenomegaly, intra-abdominal process, UTI, abdominal pain with pregnancy. Vital signs are unremarkable. Physical exam as noted above.   Labs and imaging ordered.  From prior chart review, the patient was seen in January 2023 and had trich in her urine. It was not discovered till afterwards and someone attempted to call her and left a voicemail however she never called back.  I independently reviewed and interpreted the patient's labs.  CMP shows mild hyponatremia 132 with mildly elevated glucose of 115.  Bicarb 21 but normal anion gap.  No other electrolyte or LFT abnormalities.  CBC shows no leukocytosis or anemia.  Normal platelets.  Lipase within normal limits.  Urinalysis shows cloudy urine with trace amount leukocytes but rare bacteria and there 6-10 squamous epithelials cells seen but 0-5 white blood cells.  Will order urine culture given patient's pregnancy.  hCG quant shows 101,562.  Wet prep is positive for trichomonas.  GC chlamydia pending.  Patient is [redacted] weeks gestation. She is having suprapubic tenderness upon palpation, will order [place]. After discussing with my attending, I do not think the patient needs any  emergent MRI of the abdomen for this non tender and non painful mass in her LUQ. Her lab work is stable as well as her vital signs. She can follow up outpatient for an MRI.   Ultrasound shows 1. Single live intrauterine pregnancy as above, estimated age 57 weeks and  2 days. 2. Small subchorionic hemorrhage. 3. Otherwise unremarkable exam.  Patient initially declined an external vaginal exam. Upon discharge, she changed her mind. It was revealed that she has a bartholin's cyst. Please see procedure note.   [facility] about possible antibiotics given the patient's first trimester gestation. We came to the conclusion of Keflex '500mg'$  QID for 7 days given that she was going to been given flagyl for trich. The keflex would also cover any UTI from her questionable UA. I spoke with OBGYN. They were ok with Keflex '500mg'$  QID for antibiotic coverage as well as she can still use her progesterone suppositories with the incision, and of course the flagyl coverage for trich.   Thankfully, emergency department workup does not suggest an emergent condition requiring admission or immediate intervention beyond what has been performed at this time. The plan is: take Flagyl and Keflex for coverage after bartholin's cyst I&D, trich, and possible UTI. Recommended following up with her PCP or OBGYN about the abdominal MRI. We discussed sitz baths and good hygiene. Discussed peri bottles and wash and using aquaphor or desitin to cover the area. We discussed strict return precautions and red flag symptoms. The patient verbalized her understanding and agrees to the plan. The patient is stable and being discharge home in good condition. The patient is safe for discharge and has been instructed to return immediately for worsening symptoms, change in symptoms or any other concerns.  I discussed this case with my attending physician who cosigned this note including patient's presenting symptoms, physical  exam, and planned diagnostics and interventions. Attending physician stated agreement with plan or made changes to plan which were implemented.   Portions of this report may have been transcribed using voice recognition software. Every effort was made to ensure accuracy; however, inadvertent computerized transcription errors may be present.   Final Clinical Impression(s) / ED Diagnoses Final diagnoses:  Trichomoniasis  Abdominal pain during pregnancy in first trimester  Bartholin cyst    Rx / DC Orders ED Discharge Orders          Ordered    metroNIDAZOLE (FLAGYL) 500 MG tablet  2 times daily        01/18/23 0042    cephALEXin (KEFLEX) 500 MG capsule  4 times daily        01/18/23 0042              Sherrell Puller, PA-C 01/18/23 1455    Sherwood Gambler, MD 01/20/23 1110

## 2023-01-18 NOTE — Discharge Instructions (Addendum)
You were seen in the emergency room today for evaluation of your multiple symptoms.  For your left upper quadrant mass, I make sure to follow-up with a primary care doctor for this as you will likely need an MRI.  Your lab work does not show any signs of any great abnormalities and this can be done outpatient.  Additionally, you did have trichomoniasis in your wet prep.  For this you may take Flagyl twice a day for the next week.  We were able to drain your Bartholin cyst here as well.  You will need to be on additional antibiotic called Keflex which she will take 4 times a day for the next week.  Please make sure you follow-up with your OB/GYN for test of cure for trichomonas.  Additionally, so that she can look at your Bartholin's incision site.  Please make sure you are keeping the area covered with either Aquaphor, Vaseline, or Desitin ointment.  Please also do some sitz bath's as well to keep the area clean.  You look into doing a Peri wash as well.  No sexual intercourse for the next two weeks as you can re-contaminate yourself or your partner. If you have any concerns, new or worsening symptoms, please return to the nearest emergency room for reevaluation.  Contact a health care provider if: You have chills or a fever. You have pain even after taking medicine. Your incision has signs of infection. Your catheter comes out.

## 2023-01-19 LAB — URINE CULTURE: Culture: 40000 — AB

## 2023-01-19 LAB — GC/CHLAMYDIA PROBE AMP (~~LOC~~) NOT AT ARMC
Chlamydia: NEGATIVE
Comment: NEGATIVE
Comment: NORMAL
Neisseria Gonorrhea: NEGATIVE

## 2023-01-20 ENCOUNTER — Telehealth (HOSPITAL_BASED_OUTPATIENT_CLINIC_OR_DEPARTMENT_OTHER): Payer: Self-pay

## 2023-01-20 NOTE — Telephone Encounter (Signed)
Post ED Visit - Positive Culture Follow-up  Culture report reviewed by antimicrobial stewardship pharmacist: [place] Team '[x]'$  Erskine Speed, Pharm.D. '[]'$  Heide Guile, Pharm.D., BCPS AQ-ID '[]'$  Parks Neptune, Pharm.D., BCPS '[]'$  Alycia Rossetti, Pharm.D., BCPS '[]'$  [place], Pharm.D., BCPS, AAHIVP '[]'$  Legrand Como, Pharm.D., BCPS, AAHIVP '[]'$  Salome Arnt, PharmD, BCPS '[]'$  Johnnette Gourd, PharmD, BCPS '[]'$  Hughes Better, PharmD, BCPS '[]'$  Leeroy Cha, PharmD '[]'$  Laqueta Linden, PharmD, BCPS '[]'$  Albertina Parr, PharmD  [place] Team '[]'$  Leodis Sias, PharmD '[]'$  Lindell Spar, PharmD '[]'$  Royetta Asal, PharmD '[]'$  Graylin Shiver, Rph '[]'$  Rema Fendt) Glennon Mac, PharmD '[]'$  Arlyn Dunning, PharmD '[]'$  Netta Cedars, PharmD '[]'$  Dia Sitter, PharmD '[]'$  Leone Haven, PharmD '[]'$  Gretta Arab, PharmD '[]'$  Theodis Shove, PharmD '[]'$  Peggyann Juba, PharmD '[]'$  Reuel Boom, PharmD   Positive urine culture Treated with Cephalexin and Metronidazole, organism sensitive to the same and no further patient follow-up is required at this time.  Glennon Hamilton 01/20/2023, 10:08 AM

## 2023-01-20 NOTE — Telephone Encounter (Signed)
Formatting of this note might be different from the original.  [facility] ED Visit - Positive Culture Follow-up    Culture report reviewed by antimicrobial stewardship pharmacist:  [place] Team  [x]  Erskine Speed, Pharm.D.  []  Heide Guile, Pharm.D., BCPS AQ-ID  []  Parks Neptune, Pharm.D., BCPS  []  Alycia Rossetti, Pharm.D., BCPS  []  [place], [place].D., BCPS, AAHIVP  []  Legrand Como, Pharm.D., BCPS, AAHIVP  []  Salome Arnt, PharmD, BCPS  []  Johnnette Gourd, PharmD, BCPS  []  Hughes Better, PharmD, BCPS  []  Leeroy Cha, PharmD  []  Laqueta Linden, PharmD, BCPS  []  Albertina Parr, PharmD    [place] Team  []  Leodis Sias, PharmD  []  Lindell Spar, PharmD  []  Royetta Asal, PharmD  []  Graylin Shiver, Rph  []  Rema Fendt) Glennon Mac, PharmD  []  Arlyn Dunning, PharmD  []  Netta Cedars, PharmD  []  Dia Sitter, PharmD  []  Leone Haven, PharmD  []  Gretta Arab, PharmD  []  Theodis Shove, PharmD  []  Peggyann Juba, PharmD  []  Reuel Boom, PharmD    Positive urine culture  Treated with Cephalexin and Metronidazole, organism sensitive to the same and no further patient follow-up is required at this time.    Glennon Hamilton  01/20/2023, 10:08 AM    Electronically signed by Sherlon Handing, RN at 01/20/2023 10:09 AM EST

## 2023-02-06 ENCOUNTER — Inpatient Hospital Stay
Admit: 2023-02-06 | Discharge: 2023-02-06 | Disposition: A | Discharge status: Home / Self Care | Payer: TRICARE (CHAMPUS)

## 2023-02-19 ENCOUNTER — Encounter (HOSPITAL_COMMUNITY): Payer: Self-pay

## 2023-02-19 ENCOUNTER — Emergency Department (HOSPITAL_COMMUNITY)
Admission: EM | Admit: 2023-02-19 | Discharge: 2023-02-19 | Disposition: A | Discharge status: Home / Self Care | Attending: Emergency Medicine | Admitting: Emergency Medicine

## 2023-02-19 DIAGNOSIS — Z3A12 12 weeks gestation of pregnancy: Secondary | ICD-10-CM | POA: Diagnosis not present

## 2023-02-19 DIAGNOSIS — O4690 Antepartum hemorrhage, unspecified, unspecified trimester: Secondary | ICD-10-CM

## 2023-02-19 DIAGNOSIS — O208 Other hemorrhage in early pregnancy: Secondary | ICD-10-CM | POA: Diagnosis present

## 2023-02-19 NOTE — ED Triage Notes (Signed)
Patient arrives with bleeding that started 2 hours ago. Patient also states she passed a blood clot. Patient feels pressure in the abdomen area. Patient is [redacted] weeks pregnant. G4P0. Patient is seeing [facility] [place].

## 2023-02-20 NOTE — ED Provider Notes (Signed)
[facility] Provider Note   CSN: [id number] Arrival date & time: 02/19/23  0149     History  Chief Complaint  Patient presents with   Vaginal Bleeding    Terri Cochran is a 32 y.o. female.  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 Some spotting through the night so came here for eval. No other changes. No pain.    Vaginal Bleeding      Home Medications Prior to Admission medications   Medication Sig Start Date End Date Taking? Authorizing Provider  buPROPion (WELLBUTRIN XL) 150 MG 24 hr tablet  12/20/22   [provider]  busPIRone (BUSPAR) 15 MG tablet  12/20/22   [provider]  cephALEXin (KEFLEX) 500 MG capsule Take 1 capsule (500 mg total) by mouth 4 (four) times daily. 01/18/23   Sherrell Puller, PA-C  enoxaparin (LOVENOX) 40 MG/0.4ML injection Inject 40 mg into the skin daily. 12/30/22   [provider]  metroNIDAZOLE (FLAGYL) 500 MG tablet Take 1 tablet (500 mg total) by mouth 2 (two) times daily. 01/18/23   Sherrell Puller, PA-C  progesterone 200 MG SUPP Place 200 mg vaginally 2 (two) times daily.    [provider]      Allergies    Amoxicillin    Review of Systems   Review of Systems  Genitourinary:  Positive for vaginal bleeding.    Physical Exam Updated Vital Signs BP 117/84   Pulse 92   Temp 98.3 F (36.8 C) (Oral)   Resp 16   Ht 5\' 6"  (1.676 m)   Wt 111.1 kg   LMP 02/13/2022 (Exact Date)   SpO2 99%   BMI 39.54 kg/m  Physical Exam Vitals and nursing note reviewed.  Constitutional:      Appearance: She is well-developed.  HENT:     Head: Normocephalic and atraumatic.     Mouth/Throat:     Mouth: Mucous membranes are moist.     Pharynx: Oropharynx is clear.  Eyes:     Pupils: Pupils are equal, round, and reactive to light.  Cardiovascular:     Rate and Rhythm: Normal rate and regular rhythm.  Pulmonary:     Effort:  No respiratory distress.     Breath sounds: No stridor.  Abdominal:     General: There is no distension.     Tenderness: There is no abdominal tenderness.  Genitourinary:    Comments: Deferred Musculoskeletal:     Cervical back: Normal range of motion.  Skin:    General: Skin is warm and dry.  Neurological:     Mental Status: She is alert.     ED Results / Procedures / Treatments   Labs (all labs ordered are listed, but only abnormal results are displayed) Labs Reviewed - No data to display  EKG None  Radiology No results found.  Procedures Procedures    Medications Ordered in ED Medications - No data to display  ED Course/ Medical Decision Making/ A&P                             Medical Decision Making  Bedsude US showed IUP with fetal cardiac activity. Patient declines further workup. Understands how to deal with it if it is a miscarriage. Will fu w/ ob. Bleeding precautions provided.   Final Clinical Impression(s) / ED Diagnoses Final diagnoses:  Vaginal bleeding in pregnancy  Rx / DC Orders ED Discharge Orders     None         Cayne Yom, Corene Cornea, MD 02/20/23 [phone]

## 2023-02-22 ENCOUNTER — Other Ambulatory Visit: Payer: Self-pay

## 2023-02-22 ENCOUNTER — Encounter (HOSPITAL_COMMUNITY): Payer: Self-pay | Admitting: *Deleted

## 2023-02-22 ENCOUNTER — Emergency Department (HOSPITAL_COMMUNITY)

## 2023-02-22 ENCOUNTER — Emergency Department (HOSPITAL_COMMUNITY)
Admission: EM | Admit: 2023-02-22 | Discharge: 2023-02-22 | Disposition: A | Discharge status: Home / Self Care | Attending: Emergency Medicine | Admitting: Emergency Medicine

## 2023-02-22 DIAGNOSIS — O26851 Spotting complicating pregnancy, first trimester: Secondary | ICD-10-CM | POA: Diagnosis not present

## 2023-02-22 DIAGNOSIS — Z3A12 12 weeks gestation of pregnancy: Secondary | ICD-10-CM | POA: Diagnosis not present

## 2023-02-22 DIAGNOSIS — O418X10 Other specified disorders of amniotic fluid and membranes, first trimester, not applicable or unspecified: Secondary | ICD-10-CM

## 2023-02-22 HISTORY — DX: Personal history of other complications of pregnancy, childbirth and the puerperium: Z87.59

## 2023-02-22 LAB — CBC
HCT: 37.1 % (ref 36.0–46.0)
Hemoglobin: 12.3 g/dL (ref 12.0–15.0)
MCH: 26.9 pg (ref 26.0–34.0)
MCHC: 33.2 g/dL (ref 30.0–36.0)
MCV: 81.2 fL (ref 80.0–100.0)
Platelets: 282 10*3/uL (ref 150–400)
RBC: 4.57 MIL/uL (ref 3.87–5.11)
RDW: 16.9 % — ABNORMAL HIGH (ref 11.5–15.5)
WBC: 11 10*3/uL — ABNORMAL HIGH (ref 4.0–10.5)
nRBC: 0 % (ref 0.0–0.2)

## 2023-02-22 LAB — HCG, QUANTITATIVE, PREGNANCY: hCG, Beta Chain, Quant, S: 121448 m[IU]/mL — ABNORMAL HIGH (ref <5)

## 2023-02-22 NOTE — ED Notes (Signed)
Dc instructions reviewed with pt no questions at this time. Will follow up with obgyn tomorrow as scheduled.

## 2023-02-22 NOTE — ED Triage Notes (Signed)
Pt reports she passed a vaginal blood clot on Sunday which "looked like a baby". She reports she is currently [redacted] weeks pregnant. Denies any further vaginal bleeding or abdominal pain.

## 2023-02-22 NOTE — Discharge Instructions (Signed)
Rest and make sure you are getting plenty of fluids.  Plan to follow-up with your OB/GYN tomorrow as scheduled.  Please refer to the instructions below regarding activity level and other instructions.

## 2023-02-22 NOTE — ED Provider Notes (Signed)
[place] [facility]ERGENCY DEPARTMENT AT [facility] Provider Note   CSN: [id number] Arrival date & time: 02/22/23  [id number]     History  Chief Complaint  Patient presents with   Vaginal Bleeding    Terri Cochran is a 32 y.o. female presenting for evaluation of suspected miscarriage.  She is currently [redacted] weeks pregnant and past a large blood clot into the toilet bowl on Sunday, then passed a smaller clot later, she initially had some low pelvic cramping but her symptoms have resolved including she has had no more vaginal bleeding either.  She was seen here on Sunday secondary to some spotting at which time a bedside ultrasound confirmed a intrauterine pregnancy with a normal rate heartbeat.  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 She denies dizziness, weakness, n/v, fever.   The history is provided by the patient.       Home Medications Prior to Admission medications   Medication Sig Start Date End Date Taking? Authorizing Provider  enoxaparin (LOVENOX) 40 MG/0.4ML injection Inject 40 mg into the skin daily. 12/30/22  Yes [provider]  Prenatal Vit-Fe Fumarate-FA (PRENATAL MULTIVITAMIN) TABS tablet Take 1 tablet by mouth daily at 12 noon.   Yes [provider]  cephALEXin (KEFLEX) 500 MG capsule Take 1 capsule (500 mg total) by mouth 4 (four) times daily. Patient not taking: Reported on 02/22/2023 01/18/23   Achille Richansom, Riley, PA-C  metroNIDAZOLE (FLAGYL) 500 MG tablet Take 1 tablet (500 mg total) by mouth 2 (two) times daily. Patient not taking: Reported on 02/22/2023 01/18/23   Achille Richansom, Riley, PA-C      Allergies    Amoxicillin    Review of Systems   Review of Systems  Constitutional:  Negative for fever.  HENT:  Negative for congestion.   Eyes: Negative.   Respiratory:  Negative for chest tightness and shortness of breath.   Cardiovascular:  Negative for  chest pain.  Gastrointestinal:  Negative for abdominal pain, nausea and vomiting.  Genitourinary:  Positive for vaginal bleeding.       Resolved vaginal bleeding  Musculoskeletal:  Negative for arthralgias, joint swelling and neck pain.  Skin: Negative.   Neurological:  Negative for dizziness, weakness, light-headedness, numbness and headaches.  Psychiatric/Behavioral: Negative.      Physical Exam Updated Vital Signs BP 118/77 (BP Location: Right Arm)   Pulse 90   Temp 98.4 F (36.9 C)   Resp 18   Ht 5\' 6"  (1.676 m)   Wt 111.1 kg   LMP 12/26/2022 (Exact Date)   SpO2 99%   BMI 39.54 kg/m  Physical Exam Vitals and nursing note reviewed.  Constitutional:      Appearance: She is well-developed.  HENT:     Head: Normocephalic and atraumatic.  Eyes:     Conjunctiva/sclera: Conjunctivae normal.  Cardiovascular:     Rate and Rhythm: Normal rate.  Pulmonary:     Effort: Pulmonary effort is normal.     Breath sounds: No wheezing.  Abdominal:     General: Bowel sounds are normal.     Palpations: Abdomen is soft.     Tenderness: There is no abdominal tenderness. There is no guarding.  Genitourinary:    Comments: deferred Musculoskeletal:        General: Normal range of motion.     Cervical back: Normal range of motion.  Skin:  General: Skin is warm and dry.  Neurological:     Mental Status: She is alert.     ED Results / Procedures / Treatments   Labs (all labs ordered are listed, but only abnormal results are displayed) Labs Reviewed  CBC - Abnormal; Notable for the following components:      Result Value   WBC 11.0 (*)    RDW 16.9 (*)    All other components within normal limits  HCG, QUANTITATIVE, PREGNANCY - Abnormal; Notable for the following components:   hCG, Beta Chain, Quant, S 121,448 (*)    All other components within normal limits    EKG None  Radiology US OB Comp Less 14 Wks  Result Date: 02/22/2023 CLINICAL DATA:  Twelve week pregnant woman  who passed 2 large blood clots. EXAM: OBSTETRIC <14 WK ULTRASOUND TECHNIQUE: Transabdominal ultrasound was performed for evaluation of the gestation as well as the maternal uterus and adnexal regions. COMPARISON:  January 17, 2023 FINDINGS: Intrauterine gestational sac: Single Yolk sac:  Not Visualized. Embryo:  Visualized. Cardiac Activity: Visualized. Heart Rate: 157 bpm MSD:    mm    w     d CRL:   65.9 mm   12 w 6 d                  US EDC: 08/30/2023 Subchorionic hemorrhage: There is a small to moderate in size subchorionic hemorrhage. Maternal uterus/adnexae: Normal adnexa. IMPRESSION: Single live intrauterine pregnancy corresponding to 12 weeks and 6 days gestation. Small to moderate subchorionic hemorrhage. Electronically Signed   By: Ted Mcalpineobrinka  Dimitrova M.D.   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  She is not currently bleeding, and has no complaints of abdominal or pelvic pain or cramping.  She is scheduled to see her OB/GYN tomorrow morning, labs and ultrasound completed here to further assess health of this pregnancy.  Ultrasound revealing mild to moderate subchorionic hemorrhage but with a still viable 12-week 6-day pregnancy.  She was advised to avoid any lifting, strenuous activities, intercourse.  She was advised to keep her appointment with her OB/GYN tomorrow, she was stable at time of discharge.  Amount and/or Complexity of Data Reviewed External Data Reviewed: labs.    Details: Abo B+ per chart dated 03/31/22  no indication for rhogam.  Labs: ordered. Decision-making details documented in ED Course.    Details: Hcg Sharene Buttersquant [phone] Radiology:  ordered.    Details: Iup 1261w6d. Rate 157 , small to moderate subchorionic hemorrhage.           Final Clinical Impression(s) / ED Diagnoses Final diagnoses:  Subchorionic hematoma in first trimester, single or unspecified fetus    Rx / DC Orders ED Discharge Orders     None         Victoriano Laindol, Evrett Hakim, PA-C 02/23/23 2234    Cathren LaineSteinl, Kevin, MD 02/24/23 1152

## 2023-02-22 NOTE — ED Notes (Signed)
Chaplain at bedside

## 2023-03-17 NOTE — Telephone Encounter (Signed)
Attempted to call patient regarding SWL, LVM to return call.     Confirm interest in SWL pt will need to restart

## 2023-12-28 NOTE — Telephone Encounter (Signed)
Identified patient with two patient identifiers (name and DOB). Reviewed chart in preparation for encounter and have obtained necessary documentation.    Called an spoke with patient regarding restart. Patient in not interested at this time.
